# Patient Record
Sex: Female | Born: 1960 | Race: White | Hispanic: No | Marital: Married | Smoking: Never smoker
Health system: Southern US, Community
[De-identification: ages and names within clinical notes are randomized; demographics above are authoritative.]

## PROBLEM LIST (undated history)

## (undated) DIAGNOSIS — R3915 Urgency of urination: Secondary | ICD-10-CM

## (undated) DIAGNOSIS — F909 Attention-deficit hyperactivity disorder, unspecified type: Secondary | ICD-10-CM

## (undated) DIAGNOSIS — F329 Major depressive disorder, single episode, unspecified: Secondary | ICD-10-CM

## (undated) DIAGNOSIS — M1990 Unspecified osteoarthritis, unspecified site: Secondary | ICD-10-CM

## (undated) DIAGNOSIS — R310 Gross hematuria: Secondary | ICD-10-CM

## (undated) DIAGNOSIS — Z87448 Personal history of other diseases of urinary system: Secondary | ICD-10-CM

## (undated) DIAGNOSIS — F32A Depression, unspecified: Secondary | ICD-10-CM

## (undated) DIAGNOSIS — Z87440 Personal history of urinary (tract) infections: Secondary | ICD-10-CM

## (undated) DIAGNOSIS — M069 Rheumatoid arthritis, unspecified: Secondary | ICD-10-CM

## (undated) DIAGNOSIS — Q625 Duplication of ureter: Secondary | ICD-10-CM

## (undated) HISTORY — PX: URETERAL REIMPLANTION: SHX2611

## (undated) HISTORY — PX: CYSTOSCOPY WITH RETROGRADE PYELOGRAM, URETEROSCOPY AND STENT PLACEMENT: SHX5789

## (undated) HISTORY — PX: BLADDER SURGERY: SHX569

## (undated) HISTORY — PX: TONSILLECTOMY AND ADENOIDECTOMY: SUR1326

## (undated) HISTORY — PX: KIDNEY SURGERY: SHX687

## (undated) HISTORY — PX: CARPAL TUNNEL RELEASE: SHX101

---
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
## 2006-08-10 ENCOUNTER — Ambulatory Visit: Payer: Self-pay | Admitting: Family Medicine

## 2006-08-23 ENCOUNTER — Encounter: Payer: Self-pay | Admitting: Family Medicine

## 2006-09-03 ENCOUNTER — Ambulatory Visit: Payer: Self-pay | Admitting: Family Medicine

## 2006-09-08 ENCOUNTER — Ambulatory Visit: Payer: Self-pay | Admitting: Cardiology

## 2006-09-24 ENCOUNTER — Ambulatory Visit: Payer: Self-pay | Admitting: Cardiology

## 2006-09-24 ENCOUNTER — Ambulatory Visit: Payer: Self-pay

## 2006-09-24 ENCOUNTER — Encounter: Payer: Self-pay | Admitting: Cardiology

## 2006-09-28 DIAGNOSIS — F988 Other specified behavioral and emotional disorders with onset usually occurring in childhood and adolescence: Secondary | ICD-10-CM | POA: Insufficient documentation

## 2006-09-28 DIAGNOSIS — R002 Palpitations: Secondary | ICD-10-CM | POA: Insufficient documentation

## 2006-09-28 DIAGNOSIS — N393 Stress incontinence (female) (male): Secondary | ICD-10-CM | POA: Insufficient documentation

## 2006-09-28 DIAGNOSIS — J309 Allergic rhinitis, unspecified: Secondary | ICD-10-CM | POA: Insufficient documentation

## 2006-10-14 ENCOUNTER — Ambulatory Visit: Payer: Self-pay | Admitting: Family Medicine

## 2006-10-14 DIAGNOSIS — N808 Other endometriosis: Secondary | ICD-10-CM

## 2007-01-28 ENCOUNTER — Ambulatory Visit: Payer: Self-pay | Admitting: Family Medicine

## 2007-01-28 DIAGNOSIS — N644 Mastodynia: Secondary | ICD-10-CM | POA: Insufficient documentation

## 2007-02-02 ENCOUNTER — Other Ambulatory Visit: Admission: RE | Admit: 2007-02-02 | Discharge: 2007-02-02 | Payer: Self-pay | Admitting: Obstetrics and Gynecology

## 2007-04-26 ENCOUNTER — Ambulatory Visit: Payer: Self-pay | Admitting: Family Medicine

## 2007-04-26 DIAGNOSIS — F411 Generalized anxiety disorder: Secondary | ICD-10-CM | POA: Insufficient documentation

## 2007-04-26 DIAGNOSIS — N951 Menopausal and female climacteric states: Secondary | ICD-10-CM | POA: Insufficient documentation

## 2007-06-30 ENCOUNTER — Telehealth: Payer: Self-pay | Admitting: Family Medicine

## 2007-10-04 ENCOUNTER — Ambulatory Visit: Payer: Self-pay | Admitting: Family Medicine

## 2007-10-17 ENCOUNTER — Telehealth: Payer: Self-pay | Admitting: Family Medicine

## 2007-12-02 ENCOUNTER — Telehealth: Payer: Self-pay | Admitting: Family Medicine

## 2008-01-13 ENCOUNTER — Telehealth: Payer: Self-pay | Admitting: Family Medicine

## 2008-02-17 ENCOUNTER — Telehealth: Payer: Self-pay | Admitting: Family Medicine

## 2008-02-27 ENCOUNTER — Encounter: Payer: Self-pay | Admitting: Family Medicine

## 2008-04-23 ENCOUNTER — Telehealth: Payer: Self-pay | Admitting: Family Medicine

## 2008-04-24 ENCOUNTER — Other Ambulatory Visit: Admission: RE | Admit: 2008-04-24 | Discharge: 2008-04-24 | Payer: Self-pay | Admitting: Obstetrics and Gynecology

## 2008-04-24 ENCOUNTER — Encounter: Payer: Self-pay | Admitting: Family Medicine

## 2008-04-24 LAB — CONVERTED CEMR LAB
Albumin: 4.5 g/dL
Alkaline Phosphatase: 42 units/L
BUN: 19 mg/dL
CO2: 26 meq/L
Calcium: 10.6 mg/dL
Glucose, Bld: 92 mg/dL
Potassium: 5.1 meq/L
TSH: 1.58 microintl units/mL

## 2008-05-18 ENCOUNTER — Encounter: Payer: Self-pay | Admitting: Family Medicine

## 2008-05-18 ENCOUNTER — Telehealth: Payer: Self-pay | Admitting: Family Medicine

## 2008-05-18 DIAGNOSIS — R945 Abnormal results of liver function studies: Secondary | ICD-10-CM | POA: Insufficient documentation

## 2008-05-28 ENCOUNTER — Encounter: Payer: Self-pay | Admitting: Family Medicine

## 2008-05-29 ENCOUNTER — Encounter: Payer: Self-pay | Admitting: Family Medicine

## 2008-05-29 LAB — CONVERTED CEMR LAB
ALT: 55 units/L — ABNORMAL HIGH (ref 0–35)
AST: 35 units/L (ref 0–37)

## 2008-05-30 ENCOUNTER — Ambulatory Visit: Payer: Self-pay | Admitting: Family Medicine

## 2008-06-26 ENCOUNTER — Ambulatory Visit: Payer: Self-pay | Admitting: Family Medicine

## 2008-06-26 LAB — CONVERTED CEMR LAB
Glucose, Urine, Semiquant: NEGATIVE
Specific Gravity, Urine: <1.005
Urobilinogen, UA: 0.2
pH: 6

## 2008-07-27 ENCOUNTER — Ambulatory Visit: Payer: Self-pay | Admitting: Family Medicine

## 2008-08-20 ENCOUNTER — Telehealth: Payer: Self-pay | Admitting: Family Medicine

## 2008-10-09 ENCOUNTER — Ambulatory Visit: Payer: Self-pay | Admitting: Family Medicine

## 2008-10-10 ENCOUNTER — Ambulatory Visit: Payer: Self-pay | Admitting: Family Medicine

## 2008-10-10 DIAGNOSIS — R5383 Other fatigue: Secondary | ICD-10-CM

## 2008-10-10 DIAGNOSIS — R5381 Other malaise: Secondary | ICD-10-CM

## 2008-10-10 DIAGNOSIS — M2550 Pain in unspecified joint: Secondary | ICD-10-CM

## 2008-10-11 ENCOUNTER — Telehealth: Payer: Self-pay | Admitting: Family Medicine

## 2008-10-11 LAB — CONVERTED CEMR LAB
ALT: 43 units/L — ABNORMAL HIGH (ref 0–35)
AST: 26 units/L (ref 0–37)
Anti Nuclear Antibody(ANA): NEGATIVE
BUN: 11 mg/dL (ref 6–23)
Basophils Absolute: 0.1 10*3/uL (ref 0.0–0.1)
CO2: 22 meq/L (ref 19–32)
Calcium: 9.7 mg/dL (ref 8.4–10.5)
Chloride: 107 meq/L (ref 96–112)
Creatinine, Ser: 0.86 mg/dL (ref 0.40–1.20)
Eosinophils Absolute: 0.3 10*3/uL (ref 0.0–0.7)
Eosinophils Relative: 5 % (ref 0–5)
HCT: 45.8 % (ref 36.0–46.0)
Hemoglobin: 14.6 g/dL (ref 12.0–15.0)
MCV: 96 fL (ref 78.0–100.0)
Monocytes Absolute: 0.8 10*3/uL (ref 0.1–1.0)
Platelets: 347 10*3/uL (ref 150–400)
RDW: 13.2 % (ref 11.5–15.5)
Sed Rate: 14 mm/hr (ref 0–22)
TSH: 1.344 microintl units/mL (ref 0.350–4.50)
Total Bilirubin: 0.6 mg/dL (ref 0.3–1.2)

## 2008-10-15 ENCOUNTER — Telehealth: Payer: Self-pay | Admitting: Family Medicine

## 2008-10-30 ENCOUNTER — Encounter: Payer: Self-pay | Admitting: Family Medicine

## 2008-11-05 ENCOUNTER — Encounter: Payer: Self-pay | Admitting: Family Medicine

## 2008-11-09 ENCOUNTER — Ambulatory Visit: Payer: Self-pay | Admitting: Family Medicine

## 2008-11-09 DIAGNOSIS — R071 Chest pain on breathing: Secondary | ICD-10-CM

## 2008-12-03 ENCOUNTER — Telehealth: Payer: Self-pay | Admitting: Family Medicine

## 2009-01-08 ENCOUNTER — Telehealth: Payer: Self-pay | Admitting: Family Medicine

## 2009-01-28 ENCOUNTER — Telehealth: Payer: Self-pay | Admitting: Family Medicine

## 2009-02-14 ENCOUNTER — Telehealth: Payer: Self-pay | Admitting: Family Medicine

## 2009-03-25 ENCOUNTER — Telehealth: Payer: Self-pay | Admitting: Family Medicine

## 2009-03-25 ENCOUNTER — Encounter: Payer: Self-pay | Admitting: Family Medicine

## 2009-03-26 ENCOUNTER — Telehealth: Payer: Self-pay | Admitting: Family Medicine

## 2009-03-27 ENCOUNTER — Encounter: Payer: Self-pay | Admitting: Family Medicine

## 2009-04-17 ENCOUNTER — Telehealth: Payer: Self-pay | Admitting: Family Medicine

## 2009-04-17 ENCOUNTER — Ambulatory Visit: Payer: Self-pay | Admitting: Family Medicine

## 2009-04-17 DIAGNOSIS — N915 Oligomenorrhea, unspecified: Secondary | ICD-10-CM | POA: Insufficient documentation

## 2009-04-17 DIAGNOSIS — N390 Urinary tract infection, site not specified: Secondary | ICD-10-CM | POA: Insufficient documentation

## 2009-04-17 LAB — CONVERTED CEMR LAB
Bilirubin Urine: NEGATIVE
Glucose, Urine, Semiquant: 100
Specific Gravity, Urine: 1.015
Urobilinogen, UA: 1
WBC Urine, dipstick: NEGATIVE

## 2009-04-18 ENCOUNTER — Telehealth: Payer: Self-pay | Admitting: Family Medicine

## 2009-04-18 LAB — CONVERTED CEMR LAB
ALT: 188 units/L — ABNORMAL HIGH (ref 0–35)
AST: 98 units/L — ABNORMAL HIGH (ref 0–37)
Albumin: 4.8 g/dL (ref 3.5–5.2)
Alkaline Phosphatase: 41 units/L (ref 39–117)
BUN: 17 mg/dL (ref 6–23)
Basophils Absolute: 0.1 10*3/uL (ref 0.0–0.1)
Calcium: 9.8 mg/dL (ref 8.4–10.5)
Chloride: 104 meq/L (ref 96–112)
Eosinophils Relative: 4 % (ref 0–5)
HCT: 44.2 % (ref 36.0–46.0)
LH: 104.5 milliintl units/mL — ABNORMAL HIGH
Lymphocytes Relative: 30 % (ref 12–46)
Lymphs Abs: 1.3 10*3/uL (ref 0.7–4.0)
Neutro Abs: 2.3 10*3/uL (ref 1.7–7.7)
Neutrophils Relative %: 51 % (ref 43–77)
Platelets: 325 10*3/uL (ref 150–400)
Potassium: 5.5 meq/L — ABNORMAL HIGH (ref 3.5–5.3)
RDW: 14.7 % (ref 11.5–15.5)
Sodium: 137 meq/L (ref 135–145)
WBC: 4.4 10*3/uL (ref 4.0–10.5)

## 2009-04-22 ENCOUNTER — Telehealth (INDEPENDENT_AMBULATORY_CARE_PROVIDER_SITE_OTHER): Payer: Self-pay | Admitting: *Deleted

## 2009-05-15 ENCOUNTER — Telehealth: Payer: Self-pay | Admitting: Family Medicine

## 2009-05-16 ENCOUNTER — Telehealth: Payer: Self-pay | Admitting: Family Medicine

## 2009-05-17 ENCOUNTER — Encounter: Payer: Self-pay | Admitting: Family Medicine

## 2009-05-27 ENCOUNTER — Ambulatory Visit: Payer: Self-pay | Admitting: Family Medicine

## 2009-05-27 DIAGNOSIS — M069 Rheumatoid arthritis, unspecified: Secondary | ICD-10-CM | POA: Insufficient documentation

## 2009-05-28 ENCOUNTER — Encounter: Payer: Self-pay | Admitting: Family Medicine

## 2009-05-28 LAB — CONVERTED CEMR LAB: ALT: 29 units/L (ref 0–35)

## 2009-06-14 ENCOUNTER — Ambulatory Visit: Payer: Self-pay | Admitting: Family Medicine

## 2009-06-14 LAB — CONVERTED CEMR LAB
Bilirubin Urine: NEGATIVE
Glucose, Bld: 102 mg/dL
Nitrite: POSITIVE
WBC Urine, dipstick: NEGATIVE
pH: 5

## 2009-06-27 ENCOUNTER — Ambulatory Visit: Payer: Self-pay | Admitting: Family Medicine

## 2009-06-27 LAB — CONVERTED CEMR LAB
Glucose, Urine, Semiquant: 250
Nitrite: POSITIVE
Urobilinogen, UA: 1

## 2009-06-28 ENCOUNTER — Encounter: Payer: Self-pay | Admitting: Family Medicine

## 2009-07-04 ENCOUNTER — Telehealth: Payer: Self-pay | Admitting: Family Medicine

## 2009-07-05 ENCOUNTER — Encounter: Payer: Self-pay | Admitting: Family Medicine

## 2009-07-21 ENCOUNTER — Encounter: Payer: Self-pay | Admitting: Family Medicine

## 2009-07-30 ENCOUNTER — Encounter: Payer: Self-pay | Admitting: Family Medicine

## 2009-08-08 ENCOUNTER — Encounter: Payer: Self-pay | Admitting: Family Medicine

## 2009-08-19 ENCOUNTER — Telehealth: Payer: Self-pay | Admitting: Family Medicine

## 2009-08-22 ENCOUNTER — Telehealth: Payer: Self-pay | Admitting: Family Medicine

## 2009-09-09 ENCOUNTER — Encounter: Payer: Self-pay | Admitting: Family Medicine

## 2009-09-25 ENCOUNTER — Encounter: Payer: Self-pay | Admitting: Family Medicine

## 2009-10-02 ENCOUNTER — Telehealth: Payer: Self-pay | Admitting: Family Medicine

## 2009-10-28 ENCOUNTER — Ambulatory Visit: Payer: Self-pay | Admitting: Family Medicine

## 2009-11-19 ENCOUNTER — Telehealth: Payer: Self-pay | Admitting: Family Medicine

## 2009-11-27 ENCOUNTER — Encounter: Payer: Self-pay | Admitting: Family Medicine

## 2009-12-09 ENCOUNTER — Ambulatory Visit: Payer: Self-pay | Admitting: Family Medicine

## 2009-12-09 LAB — CONVERTED CEMR LAB
Glucose, Urine, Semiquant: NEGATIVE
Nitrite: POSITIVE
Specific Gravity, Urine: 1.02

## 2009-12-11 ENCOUNTER — Telehealth (INDEPENDENT_AMBULATORY_CARE_PROVIDER_SITE_OTHER): Payer: Self-pay | Admitting: *Deleted

## 2009-12-12 ENCOUNTER — Ambulatory Visit: Payer: Self-pay | Admitting: Family Medicine

## 2009-12-12 ENCOUNTER — Encounter: Payer: Self-pay | Admitting: Family Medicine

## 2009-12-12 LAB — CONVERTED CEMR LAB
Nitrite: POSITIVE
Urobilinogen, UA: 0.2

## 2009-12-13 ENCOUNTER — Encounter: Payer: Self-pay | Admitting: Family Medicine

## 2009-12-23 ENCOUNTER — Encounter: Payer: Self-pay | Admitting: Family Medicine

## 2009-12-25 ENCOUNTER — Telehealth: Payer: Self-pay | Admitting: Family Medicine

## 2009-12-26 ENCOUNTER — Telehealth: Payer: Self-pay | Admitting: Family Medicine

## 2009-12-27 ENCOUNTER — Ambulatory Visit: Payer: Self-pay | Admitting: Family Medicine

## 2009-12-27 DIAGNOSIS — R748 Abnormal levels of other serum enzymes: Secondary | ICD-10-CM | POA: Insufficient documentation

## 2010-01-24 ENCOUNTER — Ambulatory Visit: Payer: Self-pay | Admitting: Family Medicine

## 2010-01-24 LAB — CONVERTED CEMR LAB
Bilirubin Urine: NEGATIVE
Nitrite: NEGATIVE
Protein, U semiquant: NEGATIVE
Specific Gravity, Urine: 1.025

## 2010-01-27 ENCOUNTER — Encounter: Payer: Self-pay | Admitting: Family Medicine

## 2010-01-27 LAB — CONVERTED CEMR LAB
Alkaline Phosphatase: 60 units/L (ref 39–117)
Indirect Bilirubin: 0.3 mg/dL (ref 0.0–0.9)
Total Protein: 6.8 g/dL (ref 6.0–8.3)

## 2010-01-29 ENCOUNTER — Encounter: Payer: Self-pay | Admitting: Family Medicine

## 2010-01-30 ENCOUNTER — Ambulatory Visit: Payer: Self-pay | Admitting: Family Medicine

## 2010-01-30 ENCOUNTER — Telehealth (INDEPENDENT_AMBULATORY_CARE_PROVIDER_SITE_OTHER): Payer: Self-pay | Admitting: *Deleted

## 2010-01-30 LAB — CONVERTED CEMR LAB
Bilirubin Urine: NEGATIVE
Protein, U semiquant: NEGATIVE
Urobilinogen, UA: 0.2

## 2010-01-31 ENCOUNTER — Encounter: Payer: Self-pay | Admitting: Family Medicine

## 2010-02-13 ENCOUNTER — Encounter: Payer: Self-pay | Admitting: Family Medicine

## 2010-02-21 ENCOUNTER — Telehealth: Payer: Self-pay | Admitting: Family Medicine

## 2010-03-25 ENCOUNTER — Encounter: Payer: Self-pay | Admitting: Family Medicine

## 2010-03-28 ENCOUNTER — Telehealth: Payer: Self-pay | Admitting: Family Medicine

## 2010-04-03 ENCOUNTER — Telehealth: Payer: Self-pay | Admitting: Family Medicine

## 2010-04-07 ENCOUNTER — Ambulatory Visit: Payer: Self-pay | Admitting: Family Medicine

## 2010-04-07 LAB — CONVERTED CEMR LAB: Sed Rate: 20 mm/hr (ref 0–22)

## 2010-05-05 ENCOUNTER — Telehealth: Payer: Self-pay | Admitting: Family Medicine

## 2010-05-09 ENCOUNTER — Encounter: Payer: Self-pay | Admitting: Family Medicine

## 2010-06-25 ENCOUNTER — Encounter: Payer: Self-pay | Admitting: Family Medicine

## 2010-06-30 ENCOUNTER — Encounter: Payer: Self-pay | Admitting: Family Medicine

## 2010-07-30 ENCOUNTER — Telehealth: Payer: Self-pay | Admitting: Family Medicine

## 2010-09-26 ENCOUNTER — Telehealth: Payer: Self-pay | Admitting: Family Medicine

## 2010-10-10 ENCOUNTER — Encounter: Payer: Self-pay | Admitting: Family Medicine

## 2010-10-13 ENCOUNTER — Ambulatory Visit: Payer: Self-pay | Admitting: Family Medicine

## 2010-10-13 ENCOUNTER — Encounter: Admission: RE | Admit: 2010-10-13 | Discharge: 2010-10-13 | Payer: Self-pay | Admitting: Family Medicine

## 2010-10-13 ENCOUNTER — Telehealth (INDEPENDENT_AMBULATORY_CARE_PROVIDER_SITE_OTHER): Payer: Self-pay | Admitting: *Deleted

## 2010-11-11 ENCOUNTER — Ambulatory Visit: Payer: Self-pay | Admitting: Family Medicine

## 2010-11-11 DIAGNOSIS — J069 Acute upper respiratory infection, unspecified: Secondary | ICD-10-CM | POA: Insufficient documentation

## 2010-11-11 LAB — CONVERTED CEMR LAB: Rapid Strep: NEGATIVE

## 2010-12-01 ENCOUNTER — Encounter: Payer: Self-pay | Admitting: Family Medicine

## 2010-12-11 ENCOUNTER — Telehealth: Payer: Self-pay | Admitting: Family Medicine

## 2011-01-16 ENCOUNTER — Telehealth: Payer: Self-pay | Admitting: Family Medicine

## 2011-01-20 ENCOUNTER — Telehealth (INDEPENDENT_AMBULATORY_CARE_PROVIDER_SITE_OTHER): Payer: Self-pay | Admitting: *Deleted

## 2011-01-22 NOTE — Letter (Signed)
Summary: [facility]  [facility]   Imported By: Lanelle Bal 07/14/2010 08:28:50  _____________________________________________________________________  External Attachment:    Type:   Image     Comment:   External Document

## 2011-01-22 NOTE — Letter (Signed)
Summary: [facility]  [facility]   Imported By: Lanelle Bal 07/07/2010 14:05:14  _____________________________________________________________________  External Attachment:    Type:   Image     Comment:   External Document

## 2011-01-22 NOTE — Letter (Signed)
Summary: [facility]  [facility]   Imported By: Lanelle Bal 11/01/2010 11:47:30  _____________________________________________________________________  External Attachment:    Type:   Image     Comment:   External Document

## 2011-01-22 NOTE — Progress Notes (Signed)
Summary: Abnormal PPD reading     New Problems: NONSPEC REACT TUBERCULIN SKN TEST W/O ACTV TB (ICD-795.5)   New Problems: NONSPEC REACT TUBERCULIN SKN TEST W/O ACTV TB (ICD-795.5)   PPD Results    Date of reading: 10/13/2010    Results: > 15mm    Interpretation: positive   Appended Document: Abnormal PPD reading   Appended Document: Abnormal PPD reading   Appended Document: Abnormal PPD reading   Appended Document: Abnormal PPD reading

## 2011-01-22 NOTE — Assessment & Plan Note (Signed)
Summary: elevated liver enzymes   Vital Signs:  Patient profile:   50 year old female Height:      61.5 inches Weight:      140 pounds BMI:     26.12 O2 Sat:      97 % on Room air Pulse rate:   82 / minute BP sitting:   120 / 81  (left arm) Cuff size:   regular  Vitals Entered By: Payton Spark CMA (December 27, 2009 8:23 AM)  O2 Flow:  Room air CC: Discuss meds and labs from Dr. Carolan Clines. Was on methotrexate then put on leflunomide and now not taking either.    Primary Care Provider:  Seymour Bars D.O.  CC:  Discuss meds and labs from Dr. Carolan Clines. Was on methotrexate then put on leflunomide and now not taking either. .  History of Present Illness: 50 yo WF presents for f/u RA and elevated liver enzymes.  She was seeing Dr Carolan Clines at [facility] for RA, on MTX for 3 months from 2009-2010 then changed to Luflinomide from 02-2009 to 11-28-2009.  her LFTs went up and she was taken off. Her f/u labs from 12-23-2009 show that her AST dropped from 70--> 58 and her ALT went from 122--> 121.  She is not having any easy bruising or bleeding but she is having itchy skin.  She does not want to go back to Dr Carolan Clines.  She is seeing a homeopathic practioner now and doing accupuncture.  She is not having any acute joint pain.    She drinks no more than 2 ETOH / wk and she does not taking Tylenolol. She had a sensation of food getting stuck while eating pizza during a party 2 wks ago.  She did not vomit but has felt like her food/ drink causes pressure in the upper esophagus.    Current Medications (verified): 1)  Urised  Tabs (Methen-Bella-Meth Bl-Phen Sal) .Marland Kitchen.. 1 Tab By Mouth Q6h As Needed Bladder Pain 2)  Cyanocobalamin 1000 Mcg Tabs (Cyanocobalamin) .... 2 Tabs By Mouth Qd 3)  Dextrostat 10 Mg  Tabs (Dextroamphetamine Sulfate) .Marland Kitchen.. 1 Tab By Mouth Bid 4)  Fluoxetine Hcl 10 Mg Caps (Fluoxetine Hcl) .Marland Kitchen.. 1 Tab By Mouth Daily 5)  Vit E .... 1600 International Units Daily 6)  Borage Oil 1000  Mg Caps (Borage (Borago Officinalis)) .Marland Kitchen.. 1000 Mg Daily 7)  Voltaren 1 % Gel (Diclofenac Sodium) .... Apply As Directed Up To 4 X A Day 8)  Nitrofurantoin Macrocrystal 50 Mg Caps (Nitrofurantoin Macrocrystal) .... Take 1 Tablet By Mouth Once A Day X 1 Post Coital As Needed 9)  Alprazolam 0.5 Mg Tabs (Alprazolam) .... 1/2- 1 Tab By Mouth Up To Bid As Needed For Anxiety. 10)  Keflex 500 Mg Caps (Cephalexin) .Marland Kitchen.. 1 Capsule By Mouth Two Times A Day X 7 Days 11)  Pyridium 200 Mg Tabs (Phenazopyridine Hcl) .Marland Kitchen.. 1 Tab By Mouth Three Times A Day X 2 Days  Allergies (verified): 1)  ! Levaquin  Past History:  Past Medical History: Reviewed history from 06/27/2009 and no changes required. 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  bunionectom  laparoscopy ureter surgery 2D echo and nuclear ETT per Dr Jens Som normal LEEP 1995  Social History: Reviewed history from 05/27/2009 and no changes required. 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  Has 50 yo son and 9 yo daughter.  Nonsmoker.  2 ETOH drinks per wk.  Works out 3-4 days/wk.  Sexually active.  Review of Systems      See HPI  Physical Exam  General:  alert, well-developed, well-nourished, well-hydrated, and overweight-appearing.   Head:  normocephalic and atraumatic.   Eyes:  sclera non icteric Mouth:  pharynx pink and moist.   Neck:  no masses.   Lungs:  Normal respiratory effort, chest expands symmetrically. Lungs are clear to auscultation, no crackles or wheezes. Heart:  Normal rate and regular rhythm. S1 and S2 normal without gallop, murmur, click, rub or other extra sounds. Abdomen:  Bowel sounds positive,abdomen soft and non-tender without masses, organomegaly Extremities:  no LE edema Skin:  excoriations with spider angioma over  upper chest no jaundice no bruising no palmar erythema Cervical Nodes:  No lymphadenopathy noted Psych:  good eye contact, not anxious appearing, and not depressed appearing.     Impression & Recommendations:  Problem # 1:  OTHER NONSPECIFIC ABNORMAL SERUM ENZYME LEVELS (ICD-790.5) We reviewed her Jan 3rd labs from Dr Carolan Clines and she continues to have high AST/ALT levels despite 4 wks of RA meds.  She has some itchy skin both otherwise has no sequelae.    Will recheck her in 1 month, adding a RUQ u/s if still elevated.  Problem # 2:  RHEUMATOID ARTHRITIS (ICD-714.0) RA, no acute flare ups and off all RX meds at this time. She does not want to see Dr Carolan Clines back for f/u.  Complete Medication List: 1)  Urised Tabs (Methen-bella-meth bl-phen sal) .Marland Kitchen.. 1 tab by mouth q6h as needed bladder pain 2)  Cyanocobalamin 1000 Mcg Tabs (Cyanocobalamin) .... 2 tabs by mouth qd 3)  Dextrostat 10 Mg Tabs (Dextroamphetamine sulfate) .Marland Kitchen.. 1 tab by mouth bid 4)  Fluoxetine Hcl 10 Mg Caps (Fluoxetine hcl) .Marland Kitchen.. 1 tab by mouth daily 5)  Vit E  .... 1600 international units daily 6)  Borage Oil 1000 Mg Caps (Borage (borago officinalis)) .Marland Kitchen.. 1000 mg daily 7)  Voltaren 1 % Gel (Diclofenac sodium) .... Apply as directed up to 4 x a day 8)  Alprazolam 0.5 Mg Tabs (Alprazolam) .... 1/2- 1 tab by mouth up to bid as needed for anxiety.  Patient Instructions: 1)  Continue current treatment plan. 2)  Recheck hepatic function panel in 4 wks. 3)  If still elevated, will get an u/s of your liver. 4)  Try Dexilant 1 capsule by mouth daily for acid reflux. 5)  If you continue to feel food getting 'stuck' will need to see GI for endoscopy. 6)  Return OV in 4 wks with labs. Prescriptions: FLUOXETINE HCL 10 MG CAPS (FLUOXETINE HCL) 1 tab by mouth daily  #30 x 3   Entered and Authorized by:   Seymour Bars DO   Signed by:   Seymour Bars DO on 12/27/2009   Method used:   Electronically to        [facility]  [phone]* (retail)       [address]       [place], [place]  [address]       Ph: [phone] or [phone]       Fax: [phone]   RxID:   [id number] FLUOXETINE HCL 10 MG CAPS (FLUOXETINE HCL) 1 tab by mouth daily  #30 x 3   Entered and Authorized by:   Seymour Bars DO   Signed by:   Seymour Bars DO on 12/27/2009  Method used:   Electronically to        [facility] [phone]* (retail)       [address] [place], [place]  [address]       Ph: [phone] or [phone]       Fax: [phone]   RxID:   [id number] ALPRAZOLAM 0.5 MG TABS (ALPRAZOLAM) 1/2- 1 tab by mouth up to bid as needed for anxiety.  #30 x 0   Entered and Authorized by:   Seymour Bars DO   Signed by:   Seymour Bars DO on 12/27/2009   Method used:   Printed then faxed to ...       [facility] [phone]* (retail)       [address] [place], [place]  [address]       Ph: [phone] or [phone]       Fax: [phone]   RxID:   [id number]

## 2011-01-22 NOTE — Assessment & Plan Note (Signed)
Summary: nurse visit, repeat UA  Nurse Visit   Pt also requests refill on prednisone for her RA. Please advise.    Impression & Recommendations:  Problem # 1:  UTI (ICD-599.0) UA + again for infection. Pt's visit changed to nurse visit, showed up 20 min late. Treat with Bactrim DS, send for cx. Get back in with Dr Retta Diones. Her updated medication list for this problem includes:    Urised Tabs (Methen-bella-meth bl-phen sal) .Marland Kitchen... 1 tab by mouth q6h as needed bladder pain    Bactrim Ds 800-160 Mg Tabs (Sulfamethoxazole-trimethoprim) .Marland Kitchen... 1 tab by mouth two times a day x 7 days  Orders: UA Dipstick w/o Micro (automated)  (81003) T-Culture, Urine ([id number])  Complete Medication List: 1)  Urised Tabs (Methen-bella-meth bl-phen sal) .Marland Kitchen.. 1 tab by mouth q6h as needed bladder pain 2)  Cyanocobalamin 1000 Mcg Tabs (Cyanocobalamin) .... 2 tabs by mouth qd 3)  Dextrostat 10 Mg Tabs (Dextroamphetamine sulfate) .Marland Kitchen.. 1 tab by mouth bid 4)  Fluoxetine Hcl 10 Mg Caps (Fluoxetine hcl) .Marland Kitchen.. 1 tab by mouth daily 5)  Vit E  .... 1600 international units daily 6)  Borage Oil 1000 Mg Caps (Borage (borago officinalis)) .Marland Kitchen.. 1000 mg daily 7)  Voltaren 1 % Gel (Diclofenac sodium) .... Apply as directed up to 4 x a day 8)  Alprazolam 0.5 Mg Tabs (Alprazolam) .... 1/2- 1 tab by mouth up to bid as needed for anxiety. 9)  Prednisone 10 Mg Tabs (Prednisone) .... Take as directed 10)  Bactrim Ds 800-160 Mg Tabs (Sulfamethoxazole-trimethoprim) .Marland Kitchen.. 1 tab by mouth two times a day x 7 days   Allergies: 1)  ! Levaquin Laboratory Results   Urine Tests    Routine Urinalysis   Color: orange Appearance: Clear Glucose: negative   (Normal Range: Negative) Bilirubin: negative   (Normal Range: Negative) Ketone: negative   (Normal Range: Negative) Spec. Gravity: <1.005   (Normal Range: 1.003-1.035) Blood: trace-intact   (Normal Range: Negative) pH: 5.0   (Normal Range: 5.0-8.0) Protein: negative    (Normal Range: Negative) Urobilinogen: 0.2   (Normal Range: 0-1) Nitrite: positive   (Normal Range: Negative) Leukocyte Esterace: small   (Normal Range: Negative)       Orders Added: 1)  UA Dipstick w/o Micro (automated)  [81003] 2)  T-Culture, Urine [[id number]] Prescriptions: BACTRIM DS 800-160 MG TABS (SULFAMETHOXAZOLE-TRIMETHOPRIM) 1 tab by mouth two times a day x 7 days  #14 x 0   Entered and Authorized by:   Seymour Bars DO   Signed by:   Seymour Bars DO on 01/30/2010   Method used:   Electronically to        [facility] [phone]* (retail)       [address]       [place], [place]  [address]       Ph: [phone] or [phone]       Fax: [phone]   RxID:   [id number] PREDNISONE 10 MG TABS (PREDNISONE) take as directed  #30 x 0   Entered and Authorized by:   Seymour Bars DO   Signed by:   Seymour Bars DO on 01/30/2010   Method used:   Electronically to        [facility] [phone]* (retail)       [address]       [place], [place]  [address]       Ph: [phone] or [phone]       Fax: [phone]   RxID:  [id number]     Patient Instructions: 1)  UA is + for infection. 2)  Treat with 7 days of Bactrim DS 3)  Sent for culture which should be back on Monday. 4)  We will get you back in with Dr Retta Diones for recurring UTI. 5)  Prednisone RFd.  Appended Document: nurse visit, repeat UA Pt has ?'s about fatty liver dz and the meds she was taking for RA. Pt requested a call from to discuss.   Appended Document: nurse visit, repeat UA I called her back in regards to NAFLD on u/s with diet/ exercise recommendations. She will schedule her own f/u with Dr Retta Diones.  Seymour Bars, D.O.

## 2011-01-22 NOTE — Progress Notes (Signed)
Summary: Dextrostat Rx  Phone Note Refill Request Message from:  Patient on December 25, 2009 10:40 AM  Refills Requested: Medication #1:  DEXTROSTAT 10 MG  TABS 1 tab by mouth bid Initial call taken by: Payton Spark CMA,  December 25, 2009 10:40 AM    Prescriptions: DEXTROSTAT 10 MG  TABS (DEXTROAMPHETAMINE SULFATE) 1 tab by mouth bid  #60 x 0   Entered and Authorized by:   Seymour Bars DO   Signed by:   Seymour Bars DO on 12/25/2009   Method used:   Printed then faxed to ...       [facility] [phone]* (retail)       [address] [place], [place]  [address]       Ph: [phone] or [phone]       Fax: [phone]   RxID:   [id number]   Appended Document: Dextrostat Rx Rx left at desk. Pt will pick up this afternoon

## 2011-01-22 NOTE — Letter (Signed)
Summary: [facility]   Imported By: Lanelle Bal 02/20/2010 10:22:23  _____________________________________________________________________  External Attachment:    Type:   Image     Comment:   External Document

## 2011-01-22 NOTE — Progress Notes (Signed)
Summary: Refills  Phone Note Refill Request Message from:  Patient on February 21, 2010 8:38 AM  Refills Requested: Medication #1:  DEXTROSTAT 10 MG  TABS 1 tab by mouth bid  Medication #2:  ALPRAZOLAM 0.5 MG TABS 1/2- 1 tab by mouth up to bid as needed for anxiety. Initial call taken by: Payton Spark CMA,  February 21, 2010 8:38 AM    Prescriptions: ALPRAZOLAM 0.5 MG TABS (ALPRAZOLAM) 1/2- 1 tab by mouth up to bid as needed for anxiety.  #30 x 0   Entered and Authorized by:   Seymour Bars DO   Signed by:   Seymour Bars DO on 02/21/2010   Method used:   Printed then faxed to ...       [facility] [phone]* (retail)       [address]       [place], [place]  [address]       Ph: [phone] or [phone]       Fax: [phone]   RxID:   [id number] DEXTROSTAT 10 MG  TABS (DEXTROAMPHETAMINE SULFATE) 1 tab by mouth bid  #60 x 0   Entered and Authorized by:   Seymour Bars DO   Signed by:   Seymour Bars DO on 02/21/2010   Method used:   Printed then faxed to ...       [facility] [phone]* (retail)       [address]       [place], [place]  [address]       Ph: [phone] or [phone]       Fax: [phone]   RxID:   [id number]   Appended Document: Refills [name] informing Pt

## 2011-01-22 NOTE — Assessment & Plan Note (Signed)
Summary: RA flare   Vital Signs:  Patient profile:   50 year old female Height:      61.5 inches Weight:      143 pounds BMI:     26.68 O2 Sat:      97 % on Room air Pulse rate:   83 / minute BP sitting:   128 / 84  (left arm) Cuff size:   regular  Vitals Entered By: Payton Spark CMA (April 07, 2010 10:40 AM)  O2 Flow:  Room air CC: F/U. RA flare. Requests prednisone.    Primary Care Provider:  Seymour Bars D.O.  CC:  F/U. RA flare. Requests prednisone. Marland Kitchen  History of Present Illness: 70 yr WF presents for RA flare up.  She is having pain in both hands and her neck with redness, warmth and swelling x 2 wks.  She is running out of RX Prednisone.  She has appt with Dr Corliss Skains to est care May 20th in [place].  This will be her 3rd rheumatologist.  For the past year and half, she has been trying 'natural' remidies but understands that that is not enough now.  She has had problems with elevated LFTS from previous RA meds that were used.  She is using Voltaren gel and a R wrist splint but she is having more pain and trouble sleeping now.    She was recently admitted to the hospital for UTI (03/23/2010). She was discharged with Cipro. She denies any continuning symptoms of the UTI. She sees Dr. Beverely Pace.   Current Medications (verified): 1)  Cyanocobalamin 1000 Mcg Tabs (Cyanocobalamin) .... 2 Tabs By Mouth Qd 2)  Dextrostat 10 Mg  Tabs (Dextroamphetamine Sulfate) .Marland Kitchen.. 1 Tab By Mouth Bid 3)  Fluoxetine Hcl 10 Mg Caps (Fluoxetine Hcl) .Marland Kitchen.. 1 Tab By Mouth Daily 4)  Vit E .... 1600 International Units Daily 5)  Borage Oil 1000 Mg Caps (Borage (Borago Officinalis)) .Marland Kitchen.. 1000 Mg Daily 6)  Voltaren 1 % Gel (Diclofenac Sodium) .... Apply As Directed Up To 4 X A Day 7)  Alprazolam 0.5 Mg Tabs (Alprazolam) .... 1/2- 1 Tab By Mouth Up To Bid As Needed For Anxiety. 8)  Prednisone 10 Mg Tabs (Prednisone) .... Take As Directed  Allergies (verified): 1)  ! Levaquin  Past History:  Past Medical  History: congenital extra ureter  endometriosis G2P1102 NSVDs pap smears Dr Thomasena Edis 2-09 perimenopausal RA -- Dr Dareen Piano --> Dr Carolan Clines Dr Corliss Skains uro: Dr Dalstedt--> Dr Beverely Pace  Past Surgical History: Reviewed history from 04/26/2007 and no changes required.  bunionectom  laparoscopy ureter surgery 2D echo and nuclear ETT per Dr Jens Som normal LEEP 1995  Family History: Reviewed history from 10/10/2008 and no changes required. 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  Nonsmoker.  2 ETOH drinks per wk.  Works out 3-4 days/wk.  Sexually active.  Review of Systems General:  Denies fatigue, fever, sleep disorder, sweats, and weakness.  Physical Exam  General:  alert, well-developed, well-nourished, and well-hydrated.   Head:  normocephalic, atraumatic, and no abnormalities observed.   Mouth:  good dentition and pharynx pink and moist.   Neck:  globally limited active C spine ROM Lungs:  normal respiratory effort and normal breath sounds.   Heart:  normal rate, regular rhythm,  and no murmur.   Msk:  Synovial nodule on the proximal scaphoid and radius of the left wrist. active synovitis both R and L hand / finger joints with redness, edema and tenderness  Pulses:  R radial normal and L radial normal.   Skin:  turgor normal, color normal, and no rashes.   Psych:  normally interactive, good eye contact, not anxious appearing, and not depressed appearing.     Impression & Recommendations:  Problem # 1:  RHEUMATOID ARTHRITIS (ICD-714.0) RA flare up.  Treat with Prednisone taper.  She has appt with Dr Corliss Skains in 1 month for f/u. ESR today.  Voltaren gel as needed.  May need Tramadol at night. Her updated medication list for this problem  includes:    Prednisone 10 Mg Tabs (Prednisone) .Marland Kitchen... Take as directed  Orders: T-Sed Rate (Automated) [phone])  Complete Medication List: 1)  Cyanocobalamin 1000 Mcg Tabs (Cyanocobalamin) .... 2 tabs by mouth qd 2)  Dextrostat 10 Mg Tabs (Dextroamphetamine sulfate) .Marland Kitchen.. 1 tab by mouth bid 3)  Fluoxetine Hcl 10 Mg Caps (Fluoxetine hcl) .Marland Kitchen.. 1 tab by mouth daily 4)  Vit E  .... 1600 international units daily 5)  Borage Oil 1000 Mg Caps (Borage (borago officinalis)) .Marland Kitchen.. 1000 mg daily 6)  Voltaren 1 % Gel (Diclofenac sodium) .... Apply as directed up to 4 x a day 7)  Alprazolam 0.5 Mg Tabs (Alprazolam) .... 1/2- 1 tab by mouth up to bid as needed for anxiety. 8)  Prednisone 10 Mg Tabs (Prednisone) .... Take as directed  Patient Instructions: 1)  Take Prednisone 4 tabs (40 mg) once a day for 3 days then  2)  3 tabs once a day (30 mg) for 3 days then  3)  2 tabs once a day (20 mg) for 3 days then 4)  1 tab by mouth once a day (10 mg) for 3 days then 5)  1/2 tab by mouth once a day for 3 days then STOP. 6)  F/U with Dr Corliss Skains in May. 7)  ESR today. 8)  Will call you w/ results tomorrow. 9)  Call if you need to add any meds for pain.  10)    Prescriptions: PREDNISONE 10 MG TABS (PREDNISONE) take as directed  #60 x 0   Entered and Authorized by:   Seymour Bars DO   Signed by:   Seymour Bars DO on 04/07/2010   Method used:   Electronically to        [facility] [phone]* (retail)       [address]       [place], [place]  [address]       Ph: [phone] or [phone]       Fax: [phone]   RxID:   [id number]

## 2011-01-22 NOTE — Assessment & Plan Note (Signed)
Summary: f/u liver enzymes   Vital Signs:  Patient profile:   50 year old female Height:      61.5 inches Weight:      142 pounds BMI:     26.49 Pulse rate:   100 / minute BP sitting:   114 / 84  Vitals Entered By: Kandice Hams (January 24, 2010 9:02 AM) CC: 4 WK FOLLOWUP CK LIVER ENZYMES AND URINE RECHECK   Primary Care Provider:  Seymour Bars D.O.  CC:  4 WK FOLLOWUP CK LIVER ENZYMES AND URINE RECHECK.  History of Present Illness: 50 yo WF presents for f/u UTI and elevated liver enzymes secondary to meds used by Dr Carolan Clines for RA.  she has been off her RA meds for 8 wks now.  Her LFTs 1 month ago remained high.  She is drinking limited ETOH and avoids tylenol.  Due to recheck today.  Pruitis has improved.  She o/w feels great.  UTI symptoms have completeley cleared up.  Allergies: 1)  ! Levaquin  Past History:  Past Medical History: Reviewed history from 06/27/2009 and no changes required. 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  bunionectom  laparoscopy ureter surgery 2D echo and nuclear ETT per Dr Jens Som normal LEEP 1995  Social History: Reviewed history from 05/27/2009 and no changes required. 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  Nonsmoker.  2 ETOH drinks per wk.  Works out 3-4 days/wk.  Sexually active.  Review of Systems      See HPI  Physical Exam  General:  alert, well-developed, well-nourished, and well-hydrated.   Head:  normocephalic and atraumatic.   Eyes:  sclera non icteric Mouth:  good dentition and pharynx pink and moist.   Neck:  no masses.   Lungs:  Normal respiratory effort, chest expands symmetrically. Lungs are clear to auscultation, no crackles or wheezes. Heart:  Normal rate and regular rhythm. S1 and S2  normal without gallop, murmur, click, rub or other extra sounds. Skin:  color normal.  no jaundice. no palmar erythema.  Mild upper chest spider angiomas Cervical Nodes:  No lymphadenopathy noted Psych:  good eye contact, not anxious appearing, and not depressed appearing.     Impression & Recommendations:  Problem # 1:  OTHER NONSPECIFIC ABNORMAL SERUM ENZYME LEVELS (ICD-790.5) Recheck LFTs today.  Off liver toxic RA meds x 8 wks now. If still high, will get a RUQ u/s.  Problem # 2:  UTI (ICD-599.0)  UA came back to normal.  Will need f/u with Dr Normajean Baxter if continuing to have problems. Her updated medication list for this problem includes:    Urised Tabs (Methen-bella-meth bl-phen sal) .Marland Kitchen... 1 tab by mouth q6h as needed bladder pain  Orders: UA Dipstick w/o Micro (automated)  (81003)  Problem # 3:  RHEUMATOID ARTHRITIS (ICD-714.0) Check ESR today.  she has an active L wrist synovitis today. Orders: T-Sed Rate (Automated) [phone])  Complete Medication List: 1)  Urised Tabs (Methen-bella-meth bl-phen sal) .Marland Kitchen.. 1 tab by mouth q6h as needed bladder pain 2)  Cyanocobalamin 1000 Mcg Tabs (Cyanocobalamin) .... 2 tabs by mouth qd 3)  Dextrostat 10 Mg Tabs (Dextroamphetamine sulfate) .Marland Kitchen.. 1 tab by mouth bid 4)  Fluoxetine Hcl 10 Mg Caps (Fluoxetine hcl) .Marland Kitchen.. 1 tab by mouth daily 5)  Vit E  .... 1600 international units daily 6)  Borage Oil 1000 Mg Caps (Borage (borago officinalis)) .Marland Kitchen.. 1000 mg daily 7)  Voltaren 1 % Gel (Diclofenac sodium) .... Apply as directed up to 4 x a day 8)  Alprazolam 0.5 Mg Tabs (Alprazolam) .... 1/2- 1 tab by mouth up to bid as needed for anxiety.  Other Orders: T-Liver Profile [phone])  Patient Instructions: 1)  Labs today. 2)  Will call you w/ results on Monday. 3)  If LFTs are elevated, will get a RUQ u/s. 4)  Urine looks perfect.   5)  Use Prilosec OTC as needed.  Laboratory Results   Urine Tests    Routine Urinalysis   Color:  yellow Appearance: Clear Glucose: negative   (Normal Range: Negative) Bilirubin: negative   (Normal Range: Negative) Ketone: negative   (Normal Range: Negative) Spec. Gravity: 1.025   (Normal Range: 1.003-1.035) Blood: trace-intact   (Normal Range: Negative) pH: 5.5   (Normal Range: 5.0-8.0) Protein: negative   (Normal Range: Negative) Urobilinogen: 0.2   (Normal Range: 0-1) Nitrite: negative   (Normal Range: Negative) Leukocyte Esterace: negative   (Normal Range: Negative)

## 2011-01-22 NOTE — Progress Notes (Signed)
Summary: US shows fatty liver  Phone Note Outgoing Call   Summary of Call: Marcelino Duster, Pls call pt and let her know that her RUQ u/s shows fatty liver dz.  Treatment for this includes low fat diet, exercise and wt loss.  Have her f/u with me for f/u of fatty liver dz in 3 mos.   Initial call taken by: Seymour Bars DO,  January 30, 2010 10:11 AM  Follow-up for Phone Call        Pt has apt this afternoon. Will discuss then Follow-up by: Payton Spark CMA,  January 30, 2010 12:16 PM

## 2011-01-22 NOTE — Progress Notes (Signed)
Summary: Referral  Phone Note Call from Patient   Caller: Patient Summary of Call: Dr.Demarcus Thielke       Patient Call Back  [phone]  Patient wants to talk to the nurse or Dr. Dayton Scrape getting a referral. Initial call taken by: Vanessa Swaziland,  March 28, 2010 8:28 AM  Follow-up for Phone Call        Pt would like referral to see another rheum. She does not want to go back to Dr. Vanessa Kick but does prefer to stay in the [place] area.  Follow-up by: Payton Spark CMA,  March 28, 2010 8:39 AM  Additional Follow-up for Phone Call Additional follow up Details #1::        I reviewed her chart and this was the 2nd rheumatology referral that we made for her. It is up to her to call around to make an appt and let us know if our office needs to send anything. Additional Follow-up by: Seymour Bars DO,  March 30, 2010 8:05 PM     Appended Document: Referral Pt aware  Appended Document: Referral Pt states she would like to see  Hedwig Morton in [facility] office # [phone]. Pt tried to call but unable to make apt herself.   Appended Document: Referral

## 2011-01-22 NOTE — Progress Notes (Signed)
Summary: lab f/u  Phone Note Outgoing Call   Summary of Call: Pls let pt know that I left a message for Dr Carolan Clines to discuss her case.  Can you pls review w/ her what meds she is taking since her Liver enzymes remain high?  Thanks.   Initial call taken by: Seymour Bars DO,  December 26, 2009 1:32 PM  Follow-up for Phone Call        Per Dr. Cephus Richer secretary-Pt is not currently on any meds from Dr. Carolan Clines. He removed her from meds once her liver enzymes became elevated. I requested all of her records from their office and secretary agreed to have them faxed.  Follow-up by: Payton Spark CMA,  December 26, 2009 1:35 PM  Additional Follow-up for Phone Call Additional follow up Details #1::        I will talk to her in person at tomorrow's OV. Additional Follow-up by: Seymour Bars DO,  December 26, 2009 1:48 PM

## 2011-01-22 NOTE — Consult Note (Signed)
Summary: [facility]  [facility]   Imported By: Lanelle Bal 08/20/2010 14:08:56  _____________________________________________________________________  External Attachment:    Type:   Image     Comment:   External Document

## 2011-01-22 NOTE — Letter (Signed)
Summary: [facility]   Imported By: Lanelle Bal 12/12/2010 08:47:26  _____________________________________________________________________  External Attachment:    Type:   Image     Comment:   External Document

## 2011-01-22 NOTE — Progress Notes (Signed)
Summary: refill  Phone Note Refill Request Message from:  Patient on December 11, 2010 8:21 AM  Refills Requested: Medication #1:  DEXTROSTAT 10 MG  TABS 1 tab by mouth bid   Supply Requested: 1 month Call when ready [phone]   Method Requested: Pick up at Office Initial call taken by: Kathlene November LPN,  December 11, 2010 8:21 AM    Prescriptions: DEXTROSTAT 10 MG  TABS (DEXTROAMPHETAMINE SULFATE) 1 tab by mouth bid  #60 x 0   Entered and Authorized by:   Seymour Bars DO   Signed by:   Seymour Bars DO on 12/11/2010   Method used:   Print then Give to Patient   RxID:   [id number]

## 2011-01-22 NOTE — Progress Notes (Signed)
Summary: Prednisone refill  Phone Note Refill Request   Refills Requested: Medication #1:  PREDNISONE 10 MG TABS take as directed Initial call taken by: Payton Spark CMA,  April 03, 2010 3:05 PM  Follow-up for Phone Call        Denied.  I referred her to another rheumatologist which she refused.    She needs to see rheumatology not stay on Prednisone long term. Follow-up by: Seymour Bars DO,  April 03, 2010 3:24 PM     Appended Document: Prednisone refill [facility] informing Pt

## 2011-01-22 NOTE — Assessment & Plan Note (Signed)
Summary: TB +   Vital Signs:  Patient profile:   50 year old female Height:      61.5 inches Weight:      146 pounds BMI:     27.24 O2 Sat:      96 % on Room air Temp:     98.3 degrees F oral Pulse rate:   87 / minute BP sitting:   137 / 91  (left arm) Cuff size:   regular  Vitals Entered By: Payton Spark CMA (October 13, 2010 11:07 AM)  O2 Flow:  Room air CC: Abnormal PPD reading   Primary Care Provider:  Seymour Bars D.O.  CC:  Abnormal PPD reading.  History of Present Illness: 50 yo WF presents for a TB read today that was administered on Friday at Dr Reginia Forts office.  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  She has never felt symptomatic from TB.  She was hoping to start on Embrel with Dr Corliss Skains for RA.  She tested + today with 15 mm of induration that is hard and red.   She has a hx of elevated LFTs and really does not want to go on treatment for TB.  She denies wt loss, fevers, chills or cough.  Current Medications (verified): 1)  Cyanocobalamin 1000 Mcg Tabs (Cyanocobalamin) .... 2 Tabs By Mouth Qd 2)  Dextrostat 10 Mg  Tabs (Dextroamphetamine Sulfate) .Marland Kitchen.. 1 Tab By Mouth Bid 3)  Fluoxetine Hcl 10 Mg Caps (Fluoxetine Hcl) .Marland Kitchen.. 1 Tab By Mouth Daily 4)  Vit E .... 1600 International Units Daily 5)  Borage Oil 1000 Mg Caps (Borage (Borago Officinalis)) .Marland Kitchen.. 1000 Mg Daily 6)  Plaquenil 200 Mg Tabs (Hydroxychloroquine Sulfate) .... Take 1 Tab By Mouth Two Times A Day 7)  Sulfazine 500 Mg Tabs (Sulfasalazine) .... Take 1 Tab By Mouth Two Times A Day  Allergies (verified): 1)  ! Levaquin  Past History:  Past Medical History: congenital extra ureter  endometriosis [id number] NSVDs pap smears Dr Thomasena Edis 2-09 perimenopausal RA -- Dr Dareen Piano --> Dr Carolan Clines Dr Corliss Skains uro: Dr Dalstedt--> Dr Beverely Pace + PPD dating back to 1988, no treatment  Past Surgical History: Reviewed history from 04/26/2007 and no changes required.  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  Nonsmoker.  2 ETOH drinks per wk.  Works out 3-4 days/wk.  Sexually active.  Review of Systems      See HPI  Physical Exam  General:  alert, well-developed, well-nourished, and well-hydrated.   Lungs:  Normal respiratory effort, chest expands symmetrically. Lungs are clear to auscultation, no crackles or wheezes. Heart:  Normal rate and regular rhythm. S1 and S2 normal without gallop, murmur, click, rub or other extra sounds. Msk:  mild PIP synovitis Skin:  color normal.   Psych:  good eye contact, not anxious appearing, and not depressed appearing.     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 Will send OV note and CXR to Dr Corliss Skains to f/u her next steps for RA managment.  Cannot start Embrel unless TB is treated. Will send report to [facility].   Orders: T-DG Chest 2 View [phone])  Complete Medication List: 1)  Cyanocobalamin 1000 Mcg Tabs (Cyanocobalamin) .... 2 tabs by mouth qd 2)  Dextrostat 10 Mg Tabs (Dextroamphetamine sulfate) .Marland Kitchen.. 1 tab by mouth bid 3)  Fluoxetine Hcl 10 Mg Caps (Fluoxetine hcl) .Marland Kitchen.. 1 tab by mouth daily 4)  Vit E  .... 1600 international units daily 5)  Borage Oil 1000 Mg Caps (Borage (borago officinalis)) .Marland Kitchen.. 1000 mg daily 6)  Plaquenil 200 Mg Tabs (Hydroxychloroquine sulfate) .... Take 1 tab by mouth two times a day 7)  Sulfazine 500 Mg Tabs (Sulfasalazine) .... Take 1 tab by mouth two times a day   Orders Added: 1)  T-DG Chest 2 View [71020] 2)  Est. Patient Level III [78295]

## 2011-01-22 NOTE — Progress Notes (Addendum)
Summary: Refill med  Phone Note Refill Request Call back at Home Phone [phone] Message from:  Patient  Refills Requested: Medication #1:  ALPRAZOLAM 0.5 MG TABS 1 tab by mouth once daily as needed for anxiety.   Dosage confirmed as above?Dosage Confirmed   Last Refilled: 11/11/2010  Method Requested: Fax to Local Pharmacy Next Appointment Scheduled: none Initial call taken by: Francee Piccolo CMA Duncan Dull),  January 16, 2011 1:59 PM    Prescriptions: ALPRAZOLAM 0.5 MG TABS (ALPRAZOLAM) 1 tab by mouth once daily as needed for anxiety  #30 x 0   Entered and Authorized by:   Seymour Bars DO   Signed by:   Seymour Bars DO on 01/16/2011   Method used:   Printed then faxed to ...       [facility] [phone]* (retail)       [address]       [place], [place]  [address]       Ph: [phone] or [phone]       Fax: [phone]   RxID:   [id number]

## 2011-01-22 NOTE — Progress Notes (Signed)
Summary: Dextrostat refill  Phone Note Refill Request   Refills Requested: Medication #1:  DEXTROSTAT 10 MG  TABS 1 tab by mouth bid Initial call taken by: Payton Spark CMA,  September 26, 2010 12:57 PM    Prescriptions: DEXTROSTAT 10 MG  TABS (DEXTROAMPHETAMINE SULFATE) 1 tab by mouth bid  #60 x 0   Entered and Authorized by:   Seymour Bars DO   Signed by:   Seymour Bars DO on 09/26/2010   Method used:   Print then Give to Patient   RxID:   [id number]

## 2011-01-22 NOTE — Assessment & Plan Note (Signed)
Summary: URI   Vital Signs:  Patient profile:   50 year old female Height:      61.5 inches Weight:      147 pounds BMI:     27.42 O2 Sat:      96 % on Room air Temp:     98.0 degrees F oral Pulse rate:   84 / minute BP sitting:   130 / 85  (left arm) Cuff size:   regular  Vitals Entered By: Payton Spark CMA (November 11, 2010 1:02 PM)  O2 Flow:  Room air CC: ST, cough and congestion x 3 days.   Primary Care Provider:  Seymour Bars D.O.  CC:  ST and cough and congestion x 3 days.Jocelyn Kline  History of Present Illness: 50 yo WF presents for 3 days of sore throat, subjective fevers and  chills.  She had head congestion, postnasal drip and hoarsness.  She has a dry cough.  She is taking Theraflu and Tylenol which is helping some.  She is on Plaquenil and sulfasalazine for her RA.  She did just complete 7 days of Amoxicllin for a dental infection this wk.    Allergies: 1)  ! Levaquin  Past History:  Past Medical History: Reviewed history from 10/13/2010 and no changes required. congenital extra ureter  endometriosis [id number] NSVDs pap smears Dr Thomasena Edis 2-09 perimenopausal RA -- Dr Dareen Piano --> Dr Carolan Clines Dr Corliss Skains uro: Dr Dalstedt--> Dr Beverely Pace + PPD dating back to 1988, no treatment  Past Surgical History: Reviewed history from 04/26/2007 and no changes required.  bunionectom  laparoscopy ureter surgery 2D echo and nuclear ETT per Dr Jens Som normal LEEP 1995  Family History: Reviewed history from 10/10/2008 and no changes required. 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  Nonsmoker.  2 ETOH drinks per wk.  Works out 3-4 days/wk.  Sexually active.  Review of Systems      See HPI  Physical Exam  General:  alert, well-developed,  well-nourished, and well-hydrated.   Head:  normocephalic and atraumatic.   Eyes:  conjunctiva clear Ears:  EACs patent; TMs translucent and gray with good cone of light and bony landmarks.  Nose:  mild nasal congestion; no rhinorrhea Mouth:  o/p injected.  no exudates or vesicles hoarse voice Lungs:  Normal respiratory effort, chest expands symmetrically. Lungs are clear to auscultation, no crackles or wheezes. Heart:  Normal rate and regular rhythm. S1 and S2 normal without gallop, murmur, click, rub or other extra sounds. Skin:  color normal and no rashes.   Cervical Nodes:  mild anterior cervical chain LA   Impression & Recommendations:  Problem # 1:  VIRAL URI (ICD-465.9)  Supportive care for viral URI.  She did just complete a fuill course of Amoxicilin for a dental infection. Will treat with RX cough syrup at night + Advil Cold and flu during the day + supportive care measures. Call if not resolved in 7 days. Her updated medication list for this problem includes:    Promethazine-codeine 6.25-10 Mg/35ml Syrp (Promethazine-codeine) .Jocelyn KitchenMarland KitchenMarland KitchenMarland Kline 5 ml by mouth at bedtime as needed as needed cough  Orders: Rapid Strep (45409)  Complete Medication List: 1)  Cyanocobalamin 1000 Mcg Tabs (Cyanocobalamin) .... 2 tabs by mouth qd 2)  Dextrostat 10 Mg Tabs (Dextroamphetamine sulfate) .Jocelyn KitchenMarland KitchenMarland Kline  1 tab by mouth bid 3)  Fluoxetine Hcl 10 Mg Caps (Fluoxetine hcl) .Jocelyn Kline.. 1 tab by mouth daily 4)  Vit E  .... 1600 international units daily 5)  Borage Oil 1000 Mg Caps (Borage (borago officinalis)) .Jocelyn Kline.. 1000 mg daily 6)  Plaquenil 200 Mg Tabs (Hydroxychloroquine sulfate) .... Take 1 tab by mouth two times a day 7)  Sulfazine 500 Mg Tabs (Sulfasalazine) .... Take 1 tab by mouth two times a day 8)  Promethazine-codeine 6.25-10 Mg/13ml Syrp (Promethazine-codeine) .... 5 ml by mouth at bedtime as needed as needed cough 9)  Alprazolam 0.5 Mg Tabs (Alprazolam) .Jocelyn Kline.. 1 tab by mouth once daily as needed for  anxiety  Patient Instructions: 1)  For viral upper respiratory infection: 2)  Use Advil Cold and Flu for symptomatic relief during the day and RX cough syrup at night. 3)  Drink plenty of clear fluids and rest. 4)  Call if not improved after 7 days. Prescriptions: ALPRAZOLAM 0.5 MG TABS (ALPRAZOLAM) 1 tab by mouth once daily as needed for anxiety  #30 x 0   Entered and Authorized by:   Seymour Bars DO   Signed by:   Seymour Bars DO on 11/11/2010   Method used:   Printed then faxed to ...       [facility] [phone]* (retail)       [address]       [place], [place]  [address]       Ph: [phone] or [phone]       Fax: [phone]   RxID:   [id number] PROMETHAZINE-CODEINE 6.25-10 MG/5ML SYRP (PROMETHAZINE-CODEINE) 5 ml by mouth at bedtime as needed as needed cough  #100 ml x 0   Entered and Authorized by:   Seymour Bars DO   Signed by:   Seymour Bars DO on 11/11/2010   Method used:   Printed then faxed to ...       [facility] [phone]* (retail)       [address]       [place], [place]  [address]       Ph: [phone] or [phone]       Fax: [phone]   RxID:   [id number]    Orders Added: 1)  Rapid Strep [42706] 2)  Est. Patient Level III [23762]    Laboratory Results    Other Tests  Rapid Strep: negative

## 2011-01-22 NOTE — Progress Notes (Signed)
  Phone Note Refill Request Message from:  Patient on July 30, 2010 9:42 AM  Refills Requested: Medication #1:  DEXTROSTAT 10 MG  TABS 1 tab by mouth bid Initial call taken by: Payton Spark CMA,  July 30, 2010 9:42 AM    Prescriptions: DEXTROSTAT 10 MG  TABS (DEXTROAMPHETAMINE SULFATE) 1 tab by mouth bid  #60 x 0   Entered and Authorized by:   Seymour Bars DO   Signed by:   Seymour Bars DO on 07/30/2010   Method used:   Print then Give to Patient   RxID:   [id number]

## 2011-01-22 NOTE — Letter (Signed)
Summary: [facility]  [facility]   Imported By: Lanelle Bal 12/27/2009 09:49:07  _____________________________________________________________________  External Attachment:    Type:   Image     Comment:   External Document

## 2011-01-22 NOTE — Letter (Signed)
Summary: [facility]  [facility]   Imported By: Lanelle Bal 04/01/2010 12:17:58  _____________________________________________________________________  External Attachment:    Type:   Image     Comment:   External Document

## 2011-01-22 NOTE — Progress Notes (Signed)
Summary: Dextrostat refill  Phone Note Refill Request   Refills Requested: Medication #1:  DEXTROSTAT 10 MG  TABS 1 tab by mouth bid Initial call taken by: Payton Spark CMA,  May 05, 2010 4:46 PM    Prescriptions: DEXTROSTAT 10 MG  TABS (DEXTROAMPHETAMINE SULFATE) 1 tab by mouth bid  #60 x 0   Entered and Authorized by:   Seymour Bars DO   Signed by:   Seymour Bars DO on 05/05/2010   Method used:   Print then Give to Patient   RxID:   [id number]   Appended Document: Dextrostat refill Pt aware

## 2011-01-28 NOTE — Progress Notes (Signed)
Summary: [facility]-Refill Fluoxetine  Phone Note Refill Request Call back at Home Phone [phone] Message from:  Patient on January 20, 2011 8:57 AM  Refills Requested: Medication #1:  FLUOXETINE HCL 10 MG CAPS 1 tab by mouth daily   Dosage confirmed as above?Dosage Confirmed   Last Refilled: 09/22/2010 last office visit 11/11/10   Method Requested: Electronic Next Appointment Scheduled: none Initial call taken by: Francee Piccolo CMA Duncan Dull),  January 20, 2011 8:58 AM    Prescriptions: FLUOXETINE HCL 10 MG CAPS (FLUOXETINE HCL) 1 tab by mouth daily  #30.0 Capsule x 2   Entered by:   Payton Spark CMA   Authorized by:   Seymour Bars DO   Signed by:   Payton Spark CMA on 01/20/2011   Method used:   Electronically to        [facility] [phone]* (retail)       [address]       [place], [place]  [address]       Ph: [phone] or [phone]       Fax: [phone]   RxID:   [id number]

## 2011-03-16 ENCOUNTER — Telehealth: Payer: Self-pay | Admitting: *Deleted

## 2011-03-16 DIAGNOSIS — N644 Mastodynia: Secondary | ICD-10-CM

## 2011-03-16 NOTE — Telephone Encounter (Signed)
Pt [name] stating she needs an order for diagnostic mamm[facility] [place]. She had normal mammo scheduled but when she told them she was having breast pain they said she needed diagnostic order. Please advise.

## 2011-03-16 NOTE — Telephone Encounter (Signed)
I put in her order.

## 2011-03-17 NOTE — Telephone Encounter (Signed)
Order printed and faxed.

## 2011-04-02 ENCOUNTER — Encounter: Payer: Self-pay | Admitting: Family Medicine

## 2011-04-16 ENCOUNTER — Encounter: Payer: Self-pay | Admitting: Family Medicine

## 2011-04-22 ENCOUNTER — Ambulatory Visit (INDEPENDENT_AMBULATORY_CARE_PROVIDER_SITE_OTHER): Payer: BC Managed Care – PPO | Admitting: Family Medicine

## 2011-04-22 ENCOUNTER — Encounter: Payer: Self-pay | Admitting: Family Medicine

## 2011-04-22 VITALS — BP 115/79 | HR 75 | Temp 97.9°F | Ht 61.5 in | Wt 145.0 lb

## 2011-04-22 DIAGNOSIS — H579 Unspecified disorder of eye and adnexa: Secondary | ICD-10-CM

## 2011-04-22 DIAGNOSIS — R6889 Other general symptoms and signs: Secondary | ICD-10-CM

## 2011-04-22 MED ORDER — OLOPATADINE HCL 0.1 % OP SOLN: OPHTHALMIC | Status: AC

## 2011-04-22 MED ORDER — DEXTROAMPHETAMINE SULFATE 10 MG PO TABS: 10.0000 mg | ORAL_TABLET | Freq: Two times a day (BID) | ORAL | Status: DC

## 2011-04-22 NOTE — Progress Notes (Signed)
  Subjective:    Patient ID: Jocelyn Kline, female    DOB: 1961/03/19, 50 y.o.   MRN: [id number]  HPI 50 yo WF prsents for itching and irritation on the inner corner of her L eye x 3 wks.  No getting any better.  She has some drainage in the morning.  Denies blurry vision.  Wears contacts.  Denies pain with blinking.  Her eye feels watery and itchy in the corners.  Not putting anything on it.  She feels like her L lids are a little swollen.  BP 115/79  Pulse 75  Temp(Src) 97.9 F (36.6 C) (Oral)  Ht 5' 1.5" (1.562 m)  Wt 145 lb (65.772 kg)  BMI 26.95 kg/m2  SpO2 96%   Review of Systems as per HPI   Objective:   Physical Exam redness with localized edema over the inner canthus of the L eye.  No scleral injection, watering, discharge or lid edema.  EOMI.         Assessment & Plan:  Redness/ irritation inner canthus -- ? Localized allergic dermatitis with pruritis.  Will treat with both patanol drops and erythromycin ointment.  Call if not improving in 48 hrs.  Will get her in with [facility] if needed.

## 2011-04-22 NOTE — Patient Instructions (Signed)
Use both RX drops and Rx ointment for redness. Call if not improving by Fri Afternoon.

## 2011-05-05 ENCOUNTER — Other Ambulatory Visit: Payer: Self-pay | Admitting: Family Medicine

## 2011-05-08 NOTE — Assessment & Plan Note (Signed)
[facility]                              CARDIOLOGY OFFICE NOTE   NAME:Jocelyn Kline, Jocelyn Kline                     MRN:          [id number]  DATE:09/08/2006                            DOB:          23-Oct-1961    The patient is a very pleasant 50 year old female with no prior cardiac  history who I am asked to evaluate for chest pain, palpitations and an  abnormal echocardiogram.  She has no prior cardiac history, typically does  not have dyspnea on exertion, orthopnea, PND, pedal edema, syncope or  exertional chest pain.  Over the past 1 year she has noticed occasional mild  dyspnea on exertion. She has also had pain in her left upper chest that  radiates down her left arm. Typically worsens with lying on her left side.  It is non exertional nor is it pleuritic or positional or related to food.  There is no associated nausea, vomiting, shortness of breath or diaphoresis.  She has also had occasional palpitations predominantly when she feels  anxious. She also noticed and elevation in her heart rate after drinking  wine. She had an echocardiogram performed and interpreted on September 02, 2006 in [place]. She was found to have normal LV function with an ejection  fraction of 55-60%. The report states there is moderate concentric left  ventricular hypertrophy. However in reviewing the measurements, the  posterior wall is documented at 0.9 cm and the septum is documented at 0.7  cm.  Because of the above we were asked to further evaluate.   MEDICATIONS:  1. Dextroamphetamine.  2. Ginkgo.  3. Urised.  4. Vitamin B12.   ALLERGIES:  No known drug allergies.   SOCIAL HISTORY:  She does not smoke. She occasionally consumes alcohol (one  glass of wine every other evening). There is no history of drug use.   FAMILY HISTORY:  Negative for coronary artery disease or any other cardiac  history.   PAST MEDICAL HISTORY:  There is no diabetes mellitus,  hypertension or  hyperlipidemia. She has no previous cardiac disease as outlined in the HPI.  She does have a history of attention deficit disorder and anxiety. She has  had prior endometriosis. She has had prior surgery on her kidney as a child.  She also had a tonsillectomy.   REVIEW OF SYSTEMS:  There are no headaches, fevers, chills. There is no  productive cough or hemoptysis. There is no dysphagia or odynophagia,  melena, hematochezia. There is no dysuria or hematuria. There is seizure  activity.  There is no orthopnea, PND or pedal edema. The remaining systems  are negative.   PHYSICAL EXAM TODAY:  GENERAL:  She is well-developed, well-nourished, in no  acute distress.  SKIN:  Is warm and dry.  NEURO:  She does not appear depressed and there is no peripheral clubbing.  HEENT:  Unremarkable with normal eyelids.  NECK:  Supple.  LUNGS:  Clear bilaterally, I cannot appreciate bruits. There is no jugular  venous distension and there is no thyromegaly noted.  CHEST:  Clear to auscultation with normal expansion.  CARDIOVASCULAR  EXAM:  Reveals a regular rate and rhythm with normal S1 and  S2. There are no murmurs, rubs, or gallops noted.  ABDOMINAL EXAM:  Nontender, positive bowel sounds, no hepatosplenomegaly, no  mass appreciated. There is no abdominal bruit. She has 2+ femoral pulses  bilaterally and no bruits.  EXTREMITIES:  Show no edema and I can palpate no cords. She has 2+ dorsalis  pedis pulses bilaterally.  NEUROLOGIC EXAM:  Grossly intact.   Electrocardiogram today shows a normal sinus rhythm at a rate of 86. The  axis is normal. There are no significant ST changes noted. There is no  evidence of left ventricular hypertrophy.   DIAGNOSES:  1. Atypical chest pain.  2. Palpitations.  3. Question left ventricular hypertrophy.  4. History of attention deficit disorder.   PLAN:  [name] presents with complaints of atypical chest pain and is  concerned about the  possibility of left ventricular hypertrophy on her  echocardiogram.  The report does state moderate concentric left ventricular  hypertrophy but her septal and posterior wall dimensions do not support this  where otherwise stated in the report. We will plan to proceed with a stress-  echocardiogram both to rule out ischemia, although I think this is unlikely.  We will also measure her left ventricular walls at that time. If she does  have left ventricular hypertrophy then we will consider a ferritin level to  rule out infiltrative disorder, and a TSH.  We will also schedule her to  have an event monitor to evaluate her palpitations. She is also concerned  about the possibility that her attention deficit medications are  contributing.  We can review this if indeed she does have left ventricular  hypertrophy.   We will see her back in 2 weeks to review the above information.                              Madolyn Frieze Jens Som, MD, [facility]    BSC/MedQ  DD:  09/08/2006  DT:  09/09/2006  Job #:  [id number]   cc:   Seymour Bars, D.O.

## 2011-05-13 ENCOUNTER — Encounter: Payer: Self-pay | Admitting: Family Medicine

## 2011-08-25 ENCOUNTER — Other Ambulatory Visit: Payer: Self-pay | Admitting: *Deleted

## 2011-08-25 MED ORDER — DEXTROAMPHETAMINE SULFATE 10 MG PO TABS: 10.0000 mg | ORAL_TABLET | Freq: Two times a day (BID) | ORAL | Status: DC

## 2011-08-25 MED ORDER — FLUOXETINE HCL 10 MG PO CAPS: 10.0000 mg | ORAL_CAPSULE | Freq: Every day | ORAL | Status: DC

## 2011-12-17 ENCOUNTER — Other Ambulatory Visit: Payer: Self-pay | Admitting: Family Medicine

## 2012-02-22 ENCOUNTER — Other Ambulatory Visit: Payer: Self-pay | Admitting: *Deleted

## 2012-02-22 MED ORDER — DEXTROAMPHETAMINE SULFATE 10 MG PO TABS: 10.0000 mg | ORAL_TABLET | Freq: Two times a day (BID) | ORAL | Status: DC

## 2012-04-30 ENCOUNTER — Other Ambulatory Visit: Payer: Self-pay | Admitting: Family Medicine

## 2012-06-07 ENCOUNTER — Encounter: Payer: Self-pay | Admitting: Family Medicine

## 2012-06-07 ENCOUNTER — Ambulatory Visit (INDEPENDENT_AMBULATORY_CARE_PROVIDER_SITE_OTHER): Payer: BC Managed Care – PPO | Admitting: Family Medicine

## 2012-06-07 VITALS — BP 133/84 | HR 94 | Temp 98.4°F | Ht 61.5 in | Wt 146.0 lb

## 2012-06-07 DIAGNOSIS — R1310 Dysphagia, unspecified: Secondary | ICD-10-CM

## 2012-06-07 MED ORDER — ALPRAZOLAM 0.5 MG PO TABS: 0.5000 mg | ORAL_TABLET | Freq: Every evening | ORAL | Status: DC | PRN

## 2012-06-07 MED ORDER — FLUOXETINE HCL 20 MG PO CAPS: 20.0000 mg | ORAL_CAPSULE | Freq: Every day | ORAL | Status: DC

## 2012-06-07 NOTE — Patient Instructions (Signed)
Dysphagia  Swallowing problems (dysphagia) occur when solids and liquids seem to stick in your throat on the way down to your stomach, or the food takes longer to get to the stomach. Other symptoms (problems) include regurgitating (burping) up food, noises coming from the throat, chest discomfort with swallowing, and a feeling of fullness in the throat when swallowing. When blockage in the throat is complete it may be associated with drooling.  CAUSES  There are many causes of swallowing difficulties and the following is generalized information regarding a number of reasons for this problem. Problems with swallowing may occur because of problems with the muscles. The food cannot be propelled in the usual manner into the stomach. There may be ulcers, scar tissueor inflammation (soreness) in the esophagus (the food tube from the mouth to the stomach) which blocks food from passing normally into the stomach. Causes of inflammation include acid reflux from the stomach into the esophagus. Inflammation can also be caused by the herpes simplex virus, Candida (yeast), radiation (as with treatment of cancer), or inflammation from medications not taken with adequate fluids to wash them down into the stomach. There may be nerve problems so signals cannot be sent adequately telling the muscles of the esophagus to contract and move the food along. Achalasia is a rare disorder of the esophagus in which muscular contractions of the esophagus are uncoordinated. Globus hystericus is a relatively common problem in young females in which there is a sense of an obstruction or difficulty in swallowing, but in which no abnormalities can be found. This problem usually improves over time with reassurance and testing to rule out other causes.  DIAGNOSIS  A number of tests will help your caregiver know what is the cause of your swallowing problems. These tests may include a barium swallow in which x-rays are taken while you are drinking a  liquid that outlines the lining of the esophagus on x-ray. If the stomach and small bowel are also studied in this manner it is called an upper gastrointestinal exam (UGI). Endoscopy may be done in which your caregiver examines your throat, esophagus, stomach and small bowel with an instrument like a small flexible telescope. Motility studies which measure the effectiveness and coordination of the muscular contractions of the esophagus may also be done.  TREATMENT  The treatment of swallowing problems are many, varying from medications to surgical treatment. The treatment varies with the type of problem found. Your caregiver will discuss your results and treatment with you. If swallowing problems are severe the long term problems which may occur include: malnutrition, pneumonia (from food going into the breathing tubes called trachea and bronchi), and an increase in tumors (lumps) of the esophagus.  SEEK IMMEDIATE MEDICAL CARE IF:   Food or other object becomes lodged in your throat or esophagus and won't move.  Document Released: 12/04/2000 Document Revised: 11/26/2011 Document Reviewed: 07/25/2008  [employer] Patient Information 2012 [employer].

## 2012-06-07 NOTE — Progress Notes (Signed)
  Subjective:    Patient ID: Jocelyn Kline, female    DOB: 08/09/1961, 51 y.o.   MRN: [id number]  HPI 4 year relationship ended about 2 weeks ago and really stressed.  Having trouble sleeping since then. Says has a job that she really has th function well and has been challenging to make sure she is on top of it mentally and emotionally.  Has used xanax years ago and was helpfully. Does have some support in her life  When swallows a piece of meet and will become lodge in her upper chest and it will finally pass or she will vomit it. Had a bad episode over the weeknd and really thought she was going to choke. Occ heartburn but not often.    Review of Systems     Objective:   Physical Exam  Constitutional: She is oriented to person, place, and time. She appears well-developed and well-nourished.  HENT:  Head: Normocephalic and atraumatic.  Mouth/Throat: Oropharynx is clear and moist.  Cardiovascular: Normal rate, regular rhythm and normal heart sounds.   Pulmonary/Chest: Effort normal and breath sounds normal.  Abdominal: Soft. Bowel sounds are normal. She exhibits no distension and no mass. There is no tenderness. There is no rebound and no guarding.  Neurological: She is alert and oriented to person, place, and time.  Skin: Skin is warm and dry.  Psychiatric: She has a normal mood and affect. Her behavior is normal.          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  She does plan on starting counseling which I think would be fantastic. The meantime we'll increase her fluoxetine to 20 mg and also her back in 4 weeks. I did give her a small quantity of Xanax to use at bedtime to help her calm her mind and so she can go to sleep. 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 She has used it previously several years ago. Hopefully she will be doing much better when I see her back in one month.  Dysphagia - will refer to gastroenterology for evaluation with  endoscopy. Most likely she has an esophageal stricture. Handout given. I did recommend she start some Prilosec OTC said she has had some occasional heartburn symptoms as well as sometimes this can be a contributing factor to a stricture.

## 2012-06-16 ENCOUNTER — Telehealth: Payer: Self-pay | Admitting: *Deleted

## 2012-06-16 NOTE — Telephone Encounter (Signed)
Please advise on this. I don't feel comfortable selecting a dose for this pt or to even ask the patient.

## 2012-06-16 NOTE — Telephone Encounter (Signed)
First of all I cannot find documentation that she has used seraquil in the past. If she wants the seraquil if she knows the dosage prescribed up to 400 mg if she does not know the dosage that'll have to be for 100 mg one tablet at night. She may have 30 no refills until she sees or gets approval for this medication from Dr. Linford Arnold.

## 2012-06-16 NOTE — Telephone Encounter (Signed)
seroquel is a strong sleep aid to start out with. Has she tried trazodone?

## 2012-06-16 NOTE — Telephone Encounter (Signed)
Pt seen Dr. Linford Arnold last week and states she did give her some Xanax but it makes her hyper instead of helping her relax. She would like to know if we can send Seroquel PRN for sleep. States she doesn't want to try anything like Ambien. Please advise.

## 2012-06-17 MED ORDER — TRAZODONE 25 MG HALF TABLET: 25.0000 mg | ORAL_TABLET | Freq: Every day | ORAL | Status: DC

## 2012-06-17 NOTE — Telephone Encounter (Signed)
Pt informed

## 2012-06-17 NOTE — Telephone Encounter (Signed)
Start with half time may increase to full tab if half time not effective. Follow up with Dr. Linford Arnold at scheduled office visit.

## 2012-06-17 NOTE — Telephone Encounter (Signed)
Pt states that she will try the trazodone but would like to do the lowest dose b/c she is sensitive to meds.

## 2012-07-01 ENCOUNTER — Ambulatory Visit: Payer: BC Managed Care – PPO | Admitting: Family Medicine

## 2012-07-08 ENCOUNTER — Ambulatory Visit (INDEPENDENT_AMBULATORY_CARE_PROVIDER_SITE_OTHER): Payer: BC Managed Care – PPO | Admitting: Family Medicine

## 2012-07-08 ENCOUNTER — Encounter: Payer: Self-pay | Admitting: Family Medicine

## 2012-07-08 VITALS — BP 131/87 | HR 77 | Ht 61.5 in | Wt 143.0 lb

## 2012-07-08 DIAGNOSIS — Z13220 Encounter for screening for lipoid disorders: Secondary | ICD-10-CM

## 2012-07-08 DIAGNOSIS — Z5181 Encounter for therapeutic drug level monitoring: Secondary | ICD-10-CM

## 2012-07-08 DIAGNOSIS — G4700 Insomnia, unspecified: Secondary | ICD-10-CM

## 2012-07-08 DIAGNOSIS — R1310 Dysphagia, unspecified: Secondary | ICD-10-CM

## 2012-07-08 MED ORDER — DEXTROAMPHETAMINE SULFATE 10 MG PO TABS: 10.0000 mg | ORAL_TABLET | Freq: Two times a day (BID) | ORAL | Status: DC

## 2012-07-08 MED ORDER — DEXTROAMPHETAMINE SULFATE 10 MG PO TABS: 10.0000 mg | ORAL_TABLET | Freq: Two times a day (BID) | ORAL | Status: AC

## 2012-07-08 MED ORDER — RAMELTEON 8 MG PO TABS: 8.0000 mg | ORAL_TABLET | Freq: Every day | ORAL | Status: DC

## 2012-07-08 NOTE — Progress Notes (Signed)
  Subjective:    Patient ID: Jocelyn Kline, female    DOB: [birth year], 51 y.o.   MRN: [id number]  HPI Dysphagia- We had her scheduled with GI on 7/15 but she said conflicted with her schedule and has not rescheduled yet.  Had another episode and actually vomited. The prilosec has helped.   Insomnia - Trazodone made her feel groggy and gave her a HA. Even tried half a tab. Doing well on the fluoxetine. No problem falling asleep but will wake up   Review of Systems     Objective:   Physical Exam  Constitutional: She is oriented to person, place, and time. She appears well-developed and well-nourished.  HENT:  Head: Normocephalic and atraumatic.  Cardiovascular: Normal rate, regular rhythm and normal heart sounds.   Pulmonary/Chest: Effort normal and breath sounds normal.  Neurological: She is alert and oriented to person, place, and time.  Skin: Skin is warm and dry.  Psychiatric: She has a normal mood and affect. Her behavior is normal.          Assessment & Plan:  Dypsphagia - I strongly encouraged her to go ahead and reschedule her appointment with GI since she had to cancel the first one. She really needs to have an endoscopy and probably a dilatation for possible esophageal stricture. She is also 51 years old I encouraged her to ask about getting her colonoscopy at the same time as well. Continue the Prilosec since it has helped her symptoms.  Insmonia - we did discuss different options. She had grogginess and headache with trazodone. She had hallucinations with Ambien in the past. I would like to try Rozerem. Will likely need a prior authorization. Depending on the expense we could also consider Lunesta as another option. Continue to work on sleep hygiene. She could also try valerian root capsules. She said she did do this in the past at one time but did remember if it helped or not.  Declined Tdap today but says she will get it next time  She is also due for screening CMP and  lipid panel. Given lab slip today and encouraged her to the lab fasting when she is able.

## 2012-08-16 ENCOUNTER — Telehealth: Payer: Self-pay | Admitting: Family Medicine

## 2012-08-16 NOTE — Telephone Encounter (Signed)
Call pt: Please call patient. Normal mammogram.  Repeat in 1 year. See if she is interested in referral for screening colonoscopy now that she is 50.

## 2012-08-17 NOTE — Telephone Encounter (Signed)
Left message on vm

## 2012-08-19 ENCOUNTER — Encounter: Payer: Self-pay | Admitting: Family Medicine

## 2012-08-19 ENCOUNTER — Ambulatory Visit (INDEPENDENT_AMBULATORY_CARE_PROVIDER_SITE_OTHER): Payer: BC Managed Care – PPO | Admitting: Family Medicine

## 2012-08-19 VITALS — BP 143/91 | HR 80 | Wt 145.0 lb

## 2012-08-19 DIAGNOSIS — R030 Elevated blood-pressure reading, without diagnosis of hypertension: Secondary | ICD-10-CM

## 2012-08-19 DIAGNOSIS — R7611 Nonspecific reaction to tuberculin skin test without active tuberculosis: Secondary | ICD-10-CM

## 2012-08-19 DIAGNOSIS — M069 Rheumatoid arthritis, unspecified: Secondary | ICD-10-CM

## 2012-08-19 DIAGNOSIS — G4700 Insomnia, unspecified: Secondary | ICD-10-CM

## 2012-08-19 NOTE — Progress Notes (Signed)
  Subjective:    Patient ID: Jocelyn Kline, female    DOB: 08/04/1961, 51 y.o.   MRN: [id number]  HPI   Insominia - Getting about 5.5-5 hours of sleep. Doing a little better.  Has only tried the rozerim a couple of times.  No hallucinations.    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 He rheumatologist had recommend to do a course of INH before doing the injectables.  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  Elevated blood pressure-she denies taking any NSAIDs today. Denies any decongestants. She did eat high salt meal last night. No chest pain or shortness of breath. She says she does feel extremely fatigued today.   Review of Systems     Objective:   Physical Exam  Constitutional: She is oriented to person, place, and time. She appears well-developed and well-nourished.  HENT:  Head: Normocephalic and atraumatic.  Cardiovascular: Normal rate, regular rhythm and normal heart sounds.   Pulmonary/Chest: Effort normal and breath sounds normal.  Neurological: She is alert and oriented to person, place, and time.  Skin: Skin is warm and dry.  Psychiatric: She has a normal mood and affect. Her behavior is normal.          Assessment & Plan:  Insnomnia - Rec check on coupons online. I explained the Rozerem Needs to be taken on daily basis to work most effectively because of that; receptors in the brain. Works very different from Ambien which can be used primarily on when necessary basis. So far the she has done well and has not had any hallucinations like she did with Ambien in the past.  Tdap vaccine declined today.  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 She has had a negative chest x-ray in the past.  Elevated blood pressure-like her to come by for nurse check in 2-3 weeks for blood pressure. Avoid NSAIDs, decongestants and high salt foods. Avoid caffeine.

## 2012-09-05 ENCOUNTER — Ambulatory Visit: Payer: BC Managed Care – PPO | Admitting: Internal Medicine

## 2012-09-06 ENCOUNTER — Ambulatory Visit: Payer: BC Managed Care – PPO | Admitting: Internal Medicine

## 2012-09-09 ENCOUNTER — Encounter: Payer: Self-pay | Admitting: Family Medicine

## 2012-09-13 ENCOUNTER — Encounter: Payer: Self-pay | Admitting: Family Medicine

## 2012-10-04 ENCOUNTER — Ambulatory Visit: Payer: BC Managed Care – PPO | Admitting: Internal Medicine

## 2012-12-06 ENCOUNTER — Encounter: Payer: Self-pay | Admitting: Family Medicine

## 2012-12-06 ENCOUNTER — Ambulatory Visit (INDEPENDENT_AMBULATORY_CARE_PROVIDER_SITE_OTHER): Payer: BC Managed Care – PPO | Admitting: Family Medicine

## 2012-12-06 ENCOUNTER — Ambulatory Visit (INDEPENDENT_AMBULATORY_CARE_PROVIDER_SITE_OTHER): Payer: BC Managed Care – PPO

## 2012-12-06 ENCOUNTER — Encounter: Payer: Self-pay | Admitting: *Deleted

## 2012-12-06 VITALS — BP 133/87 | HR 78 | Temp 98.1°F | Resp 16 | Wt 148.0 lb

## 2012-12-06 DIAGNOSIS — K219 Gastro-esophageal reflux disease without esophagitis: Secondary | ICD-10-CM

## 2012-12-06 DIAGNOSIS — J40 Bronchitis, not specified as acute or chronic: Secondary | ICD-10-CM

## 2012-12-06 DIAGNOSIS — R0789 Other chest pain: Secondary | ICD-10-CM

## 2012-12-06 DIAGNOSIS — J329 Chronic sinusitis, unspecified: Secondary | ICD-10-CM

## 2012-12-06 LAB — COMPREHENSIVE METABOLIC PANEL WITH GFR
ALT: 100 U/L — ABNORMAL HIGH (ref 0–35)
AST: 74 U/L — ABNORMAL HIGH (ref 0–37)
Albumin: 4.8 g/dL (ref 3.5–5.2)
Alkaline Phosphatase: 55 U/L (ref 39–117)
BUN: 13 mg/dL (ref 6–23)
CO2: 25 meq/L (ref 19–32)
Calcium: 10.3 mg/dL (ref 8.4–10.5)
Chloride: 104 meq/L (ref 96–112)
Creat: 0.91 mg/dL (ref 0.50–1.10)
Glucose, Bld: 86 mg/dL (ref 70–99)
Potassium: 5 meq/L (ref 3.5–5.3)
Sodium: 139 meq/L (ref 135–145)
Total Bilirubin: 0.6 mg/dL (ref 0.3–1.2)
Total Protein: 7.3 g/dL (ref 6.0–8.3)

## 2012-12-06 LAB — CBC WITH DIFFERENTIAL/PLATELET
Basophils Absolute: 0.1 10*3/uL (ref 0.0–0.1)
Basophils Relative: 2 % — ABNORMAL HIGH (ref 0–1)
Eosinophils Absolute: 0.4 10*3/uL (ref 0.0–0.7)
Hemoglobin: 15.3 g/dL — ABNORMAL HIGH (ref 12.0–15.0)
MCH: 31.9 pg (ref 26.0–34.0)
MCHC: 33.8 g/dL (ref 30.0–36.0)
Monocytes Relative: 15 % — ABNORMAL HIGH (ref 3–12)
Neutro Abs: 2.3 10*3/uL (ref 1.7–7.7)
Neutrophils Relative %: 39 % — ABNORMAL LOW (ref 43–77)
Platelets: 323 10*3/uL (ref 150–400)
RDW: 13.7 % (ref 11.5–15.5)

## 2012-12-06 LAB — HEPATIC FUNCTION PANEL
ALT: 100 U/L — AB (ref 7–35)
Alkaline Phosphatase: 55 U/L (ref 25–125)
Bilirubin, Total: 0.6 mg/dL

## 2012-12-06 LAB — BASIC METABOLIC PANEL: Glucose: 86 mg/dL

## 2012-12-06 MED ORDER — AMOXICILLIN-POT CLAVULANATE 875-125 MG PO TABS: 1.0000 | ORAL_TABLET | Freq: Two times a day (BID) | ORAL | Status: DC

## 2012-12-06 MED ORDER — MELOXICAM 15 MG PO TABS: 15.0000 mg | ORAL_TABLET | Freq: Every day | ORAL | Status: DC

## 2012-12-06 NOTE — Progress Notes (Signed)
Subjective:    Patient ID: Jocelyn Kline, female    DOB: [birth year], 51 y.o.   MRN: [id number]  Chest Pain  This is a new problem. The current episode started 1 to 4 weeks ago (Patient reports heaviness in chest discomfort for about 2 weeks and coughing for about a week with productive cough.). The onset quality is sudden. The problem occurs constantly. The problem has been waxing and waning. The pain is present in the lateral region. The pain is at a severity of 6/10 (Pain has ranged from 8-9/10. Today it is a 6. The). The pain is moderate. The quality of the pain is described as stabbing, heavy and pressure. The pain radiates to the left arm, left neck and right neck. Associated symptoms include a cough. The cough has no precipitants. The cough is productive (In the a.m. cough is extremely worrisome and has a hacking cough nature). Color change with cough: green and thick. Nothing relieves the cough. The cough is worsened by a supine position. Risk factors include obesity and sedentary lifestyle.  Her past medical history is significant for anxiety/panic attacks and connective tissue disease.  Pertinent negatives for past medical history include no aortic aneurysm, no aortic dissection, no arrhythmia, no bicuspid aortic valve, no CAD, no cancer, no congenital heart disease, no COPD, no diabetes, no hyperlipidemia and no hypertension. Past medical history comments: HX of fatty liver  Her family medical history is significant for hypertension in family.  Pertinent negatives for family medical history include: family history of aortic dissection, no CAD in family, no diabetes in family, no heart disease in family, no early MI in family, no stroke in family and no sudden death in family. Prior diagnostic workup includes chest x-ray and stress echo (more than 2 years).  Cough Associated symptoms include chest pain. HX of fatty liver      Review of Systems  Respiratory: Positive for cough.    Cardiovascular: Positive for chest pain.  All other systems reviewed and are negative.      BP 133/87  Pulse 78  Temp 98.1 F (36.7 C)  Resp 16  Wt 148 lb (67.132 kg) Objective:   Physical Exam  Vitals reviewed. Constitutional: She is oriented to person, place, and time. She appears well-developed and well-nourished.  HENT:  Head: Normocephalic.  Nose: Mucosal edema present. Right sinus exhibits maxillary sinus tenderness. Left sinus exhibits maxillary sinus tenderness.  Mouth/Throat: She does not have dentures. Normal dentition. No dental caries.  Neck: Normal range of motion. Neck supple.  Cardiovascular: Normal rate, regular rhythm and normal heart sounds.  Exam reveals no friction rub.   No murmur heard.      Tenderness over the L chest wall/ribs  Pulmonary/Chest: Effort normal and breath sounds normal. No respiratory distress. She has no wheezes.  Neurological: She is alert and oriented to person, place, and time.  Skin: Skin is warm and dry.  Psychiatric: She has a normal mood and affect.   EKG sinus ryhthm     Assessment & Plan:  #1 chest pain/costochondritis. EKG was fine. We'll try to get echo stress test with local cardiologist Dr. Jens Som. Mobic 15 mg one tablet a day and go to the emergency room if chest pain gets worse or symptoms persist. Also will get a d-dimer to make sure PE is not a problem.  #2 bronchitis/sinusitis may be from reflux since this has only been occurring for about a week. Chest x-ray and Augmentin a 75 twice a day  for 10 days.  #3 possible reflux. Even though patient is on a PPI she does not take it everyday. I recommend she elevate the head of her bed and take her PPI every day. I have returned followup with Dr. Linford Arnold in 3 weeks.

## 2012-12-06 NOTE — Patient Instructions (Signed)
Costochondritis Costochondritis (Tietze syndrome), or costochondral separation, is a swelling and irritation (inflammation) of the tissue (cartilage) that connects your ribs with your breastbone (sternum). It may occur on its own (spontaneously), through damage caused by an accident (trauma), or simply from coughing or minor exercise. It may take up to 6 weeks to get better and longer if you are unable to be conservative in your activities. HOME CARE INSTRUCTIONS   Avoid exhausting physical activity. Try not to strain your ribs during normal activity. This would include any activities using chest, belly (abdominal), and side muscles, especially if heavy weights are used.  Use ice for 15 to 20 minutes per hour while awake for the first 2 days. Place the ice in a plastic bag, and place a towel between the bag of ice and your skin.  Only take over-the-counter or prescription medicines for pain, discomfort, or fever as directed by your caregiver. SEEK IMMEDIATE MEDICAL CARE IF:   Your pain increases or you are very uncomfortable.  You have a fever.  You develop difficulty with your breathing.  You cough up blood.  You develop worse chest pains, shortness of breath, sweating, or vomiting.  You develop new, unexplained problems (symptoms). MAKE SURE YOU:   Understand these instructions.  Will watch your condition.  Will get help right away if you are not doing well or get worse. Document Released: 09/16/2005 Document Revised: 02/29/2012 Document Reviewed: 07/25/2008 [facility] Patient Information 2013 [place], [place]. Gastroesophageal Reflux Disease, Adult Gastroesophageal reflux disease (GERD) happens when acid from your stomach flows up into the esophagus. When acid comes in contact with the esophagus, the acid causes soreness (inflammation) in the esophagus. Over time, GERD may create small holes (ulcers) in the lining of the esophagus. CAUSES   Increased body weight. This puts pressure  on the stomach, making acid rise from the stomach into the esophagus.  Smoking. This increases acid production in the stomach.  Drinking alcohol. This causes decreased pressure in the lower esophageal sphincter (valve or ring of muscle between the esophagus and stomach), allowing acid from the stomach into the esophagus.  Late evening meals and a full stomach. This increases pressure and acid production in the stomach.  A malformed lower esophageal sphincter. Sometimes, no cause is found. SYMPTOMS   Burning pain in the lower part of the mid-chest behind the breastbone and in the mid-stomach area. This may occur twice a week or more often.  Trouble swallowing.  Sore throat.  Dry cough.  Asthma-like symptoms including chest tightness, shortness of breath, or wheezing. DIAGNOSIS  Your caregiver may be able to diagnose GERD based on your symptoms. In some cases, X-rays and other tests may be done to check for complications or to check the condition of your stomach and esophagus. TREATMENT  Your caregiver may recommend over-the-counter or prescription medicines to help decrease acid production. Ask your caregiver before starting or adding any new medicines.  HOME CARE INSTRUCTIONS   Change the factors that you can control. Ask your caregiver for guidance concerning weight loss, quitting smoking, and alcohol consumption.  Avoid foods and drinks that make your symptoms worse, such as:  Caffeine or alcoholic drinks.  Chocolate.  Peppermint or mint flavorings.  Garlic and onions.  Spicy foods.  Citrus fruits, such as oranges, lemons, or limes.  Tomato-based foods such as sauce, chili, salsa, and pizza.  Fried and fatty foods.  Avoid lying down for the 3 hours prior to your bedtime or prior to taking a nap.  Eat small, frequent meals instead of large meals.  Wear loose-fitting clothing. Do not wear anything tight around your waist that causes pressure on your  stomach.  Raise the head of your bed 6 to 8 inches with wood blocks to help you sleep. Extra pillows will not help.  Only take over-the-counter or prescription medicines for pain, discomfort, or fever as directed by your caregiver.  Do not take aspirin, ibuprofen, or other nonsteroidal anti-inflammatory drugs (NSAIDs). SEEK IMMEDIATE MEDICAL CARE IF:   You have pain in your arms, neck, jaw, teeth, or back.  Your pain increases or changes in intensity or duration.  You develop nausea, vomiting, or sweating (diaphoresis).  You develop shortness of breath, or you faint.  Your vomit is green, yellow, black, or looks like coffee grounds or blood.  Your stool is red, bloody, or black. These symptoms could be signs of other problems, such as heart disease, gastric bleeding, or esophageal bleeding. MAKE SURE YOU:   Understand these instructions.  Will watch your condition.  Will get help right away if you are not doing well or get worse. Document Released: 09/16/2005 Document Revised: 02/29/2012 Document Reviewed: 06/26/2011 [facility] Patient Information 2013 [place], [place]. Chest Pain (Nonspecific) It is often hard to give a specific diagnosis for the cause of chest pain. There is always a chance that your pain could be related to something serious, such as a heart attack or a blood clot in the lungs. You need to follow up with your caregiver for further evaluation. CAUSES   Heartburn.  Pneumonia or bronchitis.  Anxiety or stress.  Inflammation around your heart (pericarditis) or lung (pleuritis or pleurisy).  A blood clot in the lung.  A collapsed lung (pneumothorax). It can develop suddenly on its own (spontaneous pneumothorax) or from injury (trauma) to the chest.  Shingles infection (herpes zoster virus). The chest wall is composed of bones, muscles, and cartilage. Any of these can be the source of the pain.  The bones can be bruised by injury.  The muscles or  cartilage can be strained by coughing or overwork.  The cartilage can be affected by inflammation and become sore (costochondritis). DIAGNOSIS  Lab tests or other studies, such as X-rays, electrocardiography, stress testing, or cardiac imaging, may be needed to find the cause of your pain.  TREATMENT   Treatment depends on what may be causing your chest pain. Treatment may include:  Acid blockers for heartburn.  Anti-inflammatory medicine.  Pain medicine for inflammatory conditions.  Antibiotics if an infection is present.  You may be advised to change lifestyle habits. This includes stopping smoking and avoiding alcohol, caffeine, and chocolate.  You may be advised to keep your head raised (elevated) when sleeping. This reduces the chance of acid going backward from your stomach into your esophagus.  Most of the time, nonspecific chest pain will improve within 2 to 3 days with rest and mild pain medicine. HOME CARE INSTRUCTIONS   If antibiotics were prescribed, take your antibiotics as directed. Finish them even if you start to feel better.  For the next few days, avoid physical activities that bring on chest pain. Continue physical activities as directed.  Do not smoke.  Avoid drinking alcohol.  Only take over-the-counter or prescription medicine for pain, discomfort, or fever as directed by your caregiver.  Follow your caregiver's suggestions for further testing if your chest pain does not go away.  Keep any follow-up appointments you made. If you do not go to an appointment,  you could develop lasting (chronic) problems with pain. If there is any problem keeping an appointment, you must call to reschedule. SEEK MEDICAL CARE IF:   You think you are having problems from the medicine you are taking. Read your medicine instructions carefully.  Your chest pain does not go away, even after treatment.  You develop a rash with blisters on your chest. SEEK IMMEDIATE MEDICAL CARE  IF:   You have increased chest pain or pain that spreads to your arm, neck, jaw, back, or abdomen.  You develop shortness of breath, an increasing cough, or you are coughing up blood.  You have severe back or abdominal pain, feel nauseous, or vomit.  You develop severe weakness, fainting, or chills.  You have a fever. THIS IS AN EMERGENCY. Do not wait to see if the pain will go away. Get medical help at once. Call your local emergency services (911 in [place] Do not drive yourself to the hospital. MAKE SURE YOU:   Understand these instructions.  Will watch your condition.  Will get help right away if you are not doing well or get worse. Document Released: 09/16/2005 Document Revised: 02/29/2012 Document Reviewed: 07/12/2008 [facility] Patient Information 2013 [place], [place].

## 2012-12-07 ENCOUNTER — Telehealth: Payer: Self-pay

## 2012-12-07 ENCOUNTER — Telehealth: Payer: Self-pay | Admitting: Physician Assistant

## 2012-12-07 ENCOUNTER — Emergency Department (HOSPITAL_BASED_OUTPATIENT_CLINIC_OR_DEPARTMENT_OTHER): Payer: BC Managed Care – PPO

## 2012-12-07 ENCOUNTER — Encounter (HOSPITAL_BASED_OUTPATIENT_CLINIC_OR_DEPARTMENT_OTHER): Payer: Self-pay

## 2012-12-07 ENCOUNTER — Emergency Department (HOSPITAL_BASED_OUTPATIENT_CLINIC_OR_DEPARTMENT_OTHER)
Admission: EM | Admit: 2012-12-07 | Discharge: 2012-12-07 | Disposition: A | Discharge status: Home / Self Care | Payer: BC Managed Care – PPO | Attending: Emergency Medicine | Admitting: Emergency Medicine

## 2012-12-07 ENCOUNTER — Other Ambulatory Visit (HOSPITAL_BASED_OUTPATIENT_CLINIC_OR_DEPARTMENT_OTHER): Payer: BC Managed Care – PPO

## 2012-12-07 DIAGNOSIS — R0789 Other chest pain: Secondary | ICD-10-CM | POA: Insufficient documentation

## 2012-12-07 DIAGNOSIS — R079 Chest pain, unspecified: Secondary | ICD-10-CM

## 2012-12-07 DIAGNOSIS — R7989 Other specified abnormal findings of blood chemistry: Secondary | ICD-10-CM

## 2012-12-07 DIAGNOSIS — R791 Abnormal coagulation profile: Secondary | ICD-10-CM | POA: Insufficient documentation

## 2012-12-07 DIAGNOSIS — Z79899 Other long term (current) drug therapy: Secondary | ICD-10-CM | POA: Insufficient documentation

## 2012-12-07 DIAGNOSIS — Z792 Long term (current) use of antibiotics: Secondary | ICD-10-CM | POA: Insufficient documentation

## 2012-12-07 DIAGNOSIS — Z8739 Personal history of other diseases of the musculoskeletal system and connective tissue: Secondary | ICD-10-CM | POA: Insufficient documentation

## 2012-12-07 DIAGNOSIS — Z9889 Other specified postprocedural states: Secondary | ICD-10-CM | POA: Insufficient documentation

## 2012-12-07 DIAGNOSIS — R05 Cough: Secondary | ICD-10-CM | POA: Insufficient documentation

## 2012-12-07 DIAGNOSIS — R059 Cough, unspecified: Secondary | ICD-10-CM | POA: Insufficient documentation

## 2012-12-07 HISTORY — DX: Unspecified osteoarthritis, unspecified site: M19.90

## 2012-12-07 LAB — TROPONIN I: Troponin I: <0.3 ng/mL (ref <0.30)

## 2012-12-07 LAB — BASIC METABOLIC PANEL
Calcium: 9.9 mg/dL (ref 8.4–10.5)
GFR calc Af Amer: 49 mL/min — ABNORMAL LOW (ref >90)
GFR calc non Af Amer: 43 mL/min — ABNORMAL LOW (ref >90)
Glucose, Bld: 102 mg/dL — ABNORMAL HIGH (ref 70–99)
Potassium: 4.1 mEq/L (ref 3.5–5.1)
Sodium: 141 mEq/L (ref 135–145)

## 2012-12-07 LAB — CBC WITH DIFFERENTIAL/PLATELET
Basophils Relative: 1 % (ref 0–1)
Eosinophils Absolute: 0.4 10*3/uL (ref 0.0–0.7)
Eosinophils Relative: 7 % — ABNORMAL HIGH (ref 0–5)
Lymphs Abs: 2 10*3/uL (ref 0.7–4.0)
MCH: 31.8 pg (ref 26.0–34.0)
MCHC: 34.3 g/dL (ref 30.0–36.0)
MCV: 92.5 fL (ref 78.0–100.0)
Neutrophils Relative %: 37 % — ABNORMAL LOW (ref 43–77)
Platelets: 248 10*3/uL (ref 150–400)
RDW: 12.6 % (ref 11.5–15.5)

## 2012-12-07 MED ORDER — SODIUM CHLORIDE 0.9 % IV BOLUS (SEPSIS)
1000.0000 mL | Freq: Once | INTRAVENOUS | Status: AC
  Administered 2012-12-07: 1000 mL via INTRAVENOUS

## 2012-12-07 MED ORDER — DIPHENHYDRAMINE HCL 50 MG/ML IJ SOLN
25.0000 mg | Freq: Once | INTRAMUSCULAR | Status: AC
  Administered 2012-12-07: 25 mg via INTRAVENOUS
  Filled 2012-12-07: qty 1

## 2012-12-07 MED ORDER — IOHEXOL 350 MG/ML SOLN: 80.0000 mL | Freq: Once | INTRAVENOUS | Status: DC | PRN

## 2012-12-07 MED ORDER — METHYLPREDNISOLONE SODIUM SUCC 125 MG IJ SOLR
125.0000 mg | Freq: Once | INTRAMUSCULAR | Status: AC
  Administered 2012-12-07: 125 mg via INTRAVENOUS
  Filled 2012-12-07: qty 2

## 2012-12-07 MED ORDER — SODIUM CHLORIDE 0.9 % IV SOLN
Freq: Once | INTRAVENOUS | Status: AC
  Administered 2012-12-07: 19:00:00 via INTRAVENOUS

## 2012-12-07 NOTE — Telephone Encounter (Signed)
Prior Auth for CT angio chest with and without contrast. CPT 71275  Auth number is [id number]

## 2012-12-07 NOTE — Telephone Encounter (Signed)
[facility] called with an Alert Lab for patient's D-Dimer at 3.98. This has not been addressed, please result to me.

## 2012-12-07 NOTE — ED Notes (Signed)
Pt has had SHOB and chest pain x 2 weeks, was seen by PCP and called today with an elevated d dimer.  Pt is here for  Diagnostics.

## 2012-12-07 NOTE — Telephone Encounter (Signed)
Left message on with results on pt's vm

## 2012-12-07 NOTE — Telephone Encounter (Signed)
Prior Auth for CT chest approved. # [id number]   Cancelled test- see next note.

## 2012-12-07 NOTE — ED Notes (Signed)
Pt returned from CT. Pt shows no sign of reaction to IV contrast. No signs of rash at IV site. Pt denies any difficulty breathing or difficulty swallowing.

## 2012-12-07 NOTE — ED Provider Notes (Signed)
History     CSN: [id number]  Arrival date & time 12/07/12  1728   First MD Initiated Contact with Patient 12/07/12 1806      Chief Complaint  Patient presents with  . Shortness of Breath  . Chest Pain    (Consider location/radiation/quality/duration/timing/severity/associated sxs/prior treatment) HPI  Patient has had some shortness of breath and anterior chest pressure for 2 weeks. She has had some URI symptoms and coughing. She has a history of rheumatoid arthritis. She was seen by her primary care physician yesterday and had a d-dimer at 3. Dr. Glade Lloyd called her today and asked her to have a CT angiogram. She told Dr. 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 She was sent here to have allergic reaction protocol done and have a CT angiogram. I spoke with Dr. Glade Lloyd prior to patient coming here. I've seen and evaluated the patient here and discussed the above history with her. She has had cough which has been nonproductive. She has been taking by mouth without difficulty. The pain has been present constantly for 2 weeks although it is waxing and waning in nature. It worsens with a deep breath and with coughing. She has not had any leg swelling, hormone use, or recent long trips or history of DVT or PE.  Past Medical History  Diagnosis Date  . Arthritis     Past Surgical History  Procedure Date  . Bladder surgery   . Kidney surgery   . Kidney surgery   . Kidney surgery     No family history on file.  History  Substance Use Topics  . Smoking status: Never Smoker   . Smokeless tobacco: Not on file  . Alcohol Use: Yes     Comment: wine every other day    OB History    Grav Para Term Preterm Abortions TAB SAB Ect Mult Living                  Review of Systems  Constitutional: Negative for fever, chills, activity change, appetite change and unexpected weight change.  HENT: Negative for sore throat, rhinorrhea, neck pain, neck stiffness and sinus  pressure.   Eyes: Negative for visual disturbance.  Respiratory: Negative for cough and shortness of breath.   Cardiovascular: Negative for chest pain and leg swelling.  Gastrointestinal: Negative for vomiting, abdominal pain, diarrhea and blood in stool.  Genitourinary: Negative for dysuria, urgency, frequency, vaginal discharge and difficulty urinating.  Musculoskeletal: Negative for myalgias, arthralgias and gait problem.  Skin: Negative for color change and rash.  Neurological: Negative for weakness, light-headedness and headaches.  Hematological: Does not bruise/bleed easily.  Psychiatric/Behavioral: Negative for dysphoric mood.    Allergies  Ambien; Levofloxacin; Trazodone and nefazodone; and Contrast media  Home Medications   Current Outpatient Rx  Name  Route  Sig  Dispense  Refill  . AMOXICILLIN-POT CLAVULANATE 875-125 MG PO TABS   Oral   Take 1 tablet by mouth 2 (two) times daily.   20 tablet   0   . DEXTROAMPHETAMINE SULFATE 10 MG PO TABS   Oral   Take 1 tablet (10 mg total) by mouth 2 (two) times daily.   60 tablet   0   . FLUOXETINE HCL 20 MG PO CAPS   Oral   Take 1 capsule (20 mg total) by mouth daily.   30 capsule   3   . HYDROXYCHLOROQUINE SULFATE 200 MG PO TABS  Take 1 1/2 tabs po qd          . MELOXICAM 15 MG PO TABS   Oral   Take 1 tablet (15 mg total) by mouth daily.   30 tablet   2   . SULFASALAZINE 500 MG PO TABS   Oral   Take 500 mg by mouth 2 (two) times daily.             BP 132/82  Pulse 80  Temp 98.2 F (36.8 C) (Oral)  Resp 16  Ht 5\' 1"  (1.549 m)  Wt 148 lb (67.132 kg)  BMI 27.96 kg/m2  SpO2 93%  Physical Exam  Nursing note and vitals reviewed. Constitutional: She appears well-developed and well-nourished.  HENT:  Head: Normocephalic and atraumatic.  Eyes: Conjunctivae normal and EOM are normal. Pupils are equal, round, and reactive to light.  Neck: Normal range of motion. Neck supple.  Cardiovascular: Normal  rate, regular rhythm, normal heart sounds and intact distal pulses.   Pulmonary/Chest: Effort normal and breath sounds normal.  Abdominal: Soft. Bowel sounds are normal.  Musculoskeletal: Normal range of motion.  Neurological: She is alert.  Skin: Skin is warm and dry.  Psychiatric: She has a normal mood and affect. Thought content normal.    ED Course  Procedures (including critical care time)  Labs Reviewed  CBC WITH DIFFERENTIAL - Abnormal; Notable for the following:    Neutrophils Relative 37 (*)     Monocytes Relative 15 (*)     Eosinophils Relative 7 (*)     All other components within normal limits  BASIC METABOLIC PANEL - Abnormal; Notable for the following:    Glucose, Bld 102 (*)     Creatinine, Ser 1.40 (*)     GFR calc non Af Amer 43 (*)     GFR calc Af Amer 49 (*)     All other components within normal limits  D-DIMER, QUANTITATIVE - Abnormal; Notable for the following:    D-Dimer, Quant 2.47 (*)     All other components within normal limits  TROPONIN I   Dg Chest 2 View  12/06/2012  *RADIOLOGY REPORT*  Clinical Data: Tightness in the chest and left-sided chest pain for 3 weeks.  CHEST - 2 VIEW  Comparison: 10/13/2010.  Findings: The lungs are clear and fully expanded.  No effusions or pneumothoraces.  The heart, mediastinum and hilar contours are normal.  There are no bony abnormalities.  IMPRESSION: Normal chest.   Original Report Authenticated By: Sander Radon, M.D.    Ct Angio Chest Pe W/cm &/or Wo Cm  12/07/2012  *RADIOLOGY REPORT*  Clinical Data: Shortness of breath, chest pain, elevated D-dimer  CT ANGIOGRAPHY CHEST  Technique:  Multidetector CT imaging of the chest using the standard protocol during bolus administration of intravenous contrast. Multiplanar reconstructed images including MIPs were obtained and reviewed to evaluate the vascular anatomy.  Contrast:  80 ml Omnipaque 350 IV  Comparison: Chest radiographs dated 12/06/2012  Findings: No evidence  of pulmonary embolism.  Lungs are essentially clear.  No suspicious pulmonary nodules.  No focal consolidation.  Mild dependent atelectasis in the bilateral lower lobes. No pleural effusion or pneumothorax.  Visualized thyroid is unremarkable.  The heart is normal in size.  No pericardial effusion.  No suspicious mediastinal, hilar, or axillary lymphadenopathy.  Visualized upper abdomen is notable for severe hepatic steatosis and a probable 1.9 cm cyst in the central liver (series 4/image 68).  Mild degenerative changes of the visualized  thoracolumbar spine.  IMPRESSION: No evidence of pulmonary embolism.  No evidence of acute cardiopulmonary disease.  Severe hepatic steatosis.   Original Report Authenticated By: Charline Bills, M.D.      No diagnosis found.   Results for orders placed during the hospital encounter of 12/07/12  CBC WITH DIFFERENTIAL      Component Value Range   WBC 5.3  4.0 - 10.5 K/uL   RBC 4.28  3.87 - 5.11 MIL/uL   Hemoglobin 13.6  12.0 - 15.0 g/dL   HCT 40.9  81.1 - 91.4 %   MCV 92.5  78.0 - 100.0 fL   MCH 31.8  26.0 - 34.0 pg   MCHC 34.3  30.0 - 36.0 g/dL   RDW 78.2  95.6 - 21.3 %   Platelets 248  150 - 400 K/uL   Neutrophils Relative 37 (*) 43 - 77 %   Neutro Abs 2.0  1.7 - 7.7 K/uL   Lymphocytes Relative 39  12 - 46 %   Lymphs Abs 2.0  0.7 - 4.0 K/uL   Monocytes Relative 15 (*) 3 - 12 %   Monocytes Absolute 0.8  0.1 - 1.0 K/uL   Eosinophils Relative 7 (*) 0 - 5 %   Eosinophils Absolute 0.4  0.0 - 0.7 K/uL   Basophils Relative 1  0 - 1 %   Basophils Absolute 0.1  0.0 - 0.1 K/uL  BASIC METABOLIC PANEL      Component Value Range   Sodium 141  135 - 145 mEq/L   Potassium 4.1  3.5 - 5.1 mEq/L   Chloride 106  96 - 112 mEq/L   CO2 25  19 - 32 mEq/L   Glucose, Bld 102 (*) 70 - 99 mg/dL   BUN 13  6 - 23 mg/dL   Creatinine, Ser 0.86 (*) 0.50 - 1.10 mg/dL   Calcium 9.9  8.4 - 57.8 mg/dL   GFR calc non Af Amer 43 (*) >90 mL/min   GFR calc Af Amer 49 (*) >90  mL/min  D-DIMER, QUANTITATIVE      Component Value Range   D-Dimer, Quant 2.47 (*) 0.00 - 0.48 ug/mL-FEU  TROPONIN I      Component Value Range   Troponin I <0.30  <0.30 ng/mL    Dg Chest 2 View  12/06/2012  *RADIOLOGY REPORT*  Clinical Data: Tightness in the chest and left-sided chest pain for 3 weeks.  CHEST - 2 VIEW  Comparison: 10/13/2010.  Findings: The lungs are clear and fully expanded.  No effusions or pneumothoraces.  The heart, mediastinum and hilar contours are normal.  There are no bony abnormalities.  IMPRESSION: Normal chest.   Original Report Authenticated By: Sander Radon, M.D.    Ct Angio Chest Pe W/cm &/or Wo Cm  12/07/2012  *RADIOLOGY REPORT*  Clinical Data: Shortness of breath, chest pain, elevated D-dimer  CT ANGIOGRAPHY CHEST  Technique:  Multidetector CT imaging of the chest using the standard protocol during bolus administration of intravenous contrast. Multiplanar reconstructed images including MIPs were obtained and reviewed to evaluate the vascular anatomy.  Contrast:  80 ml Omnipaque 350 IV  Comparison: Chest radiographs dated 12/06/2012  Findings: No evidence of pulmonary embolism.  Lungs are essentially clear.  No suspicious pulmonary nodules.  No focal consolidation.  Mild dependent atelectasis in the bilateral lower lobes. No pleural effusion or pneumothorax.  Visualized thyroid is unremarkable.  The heart is normal in size.  No pericardial effusion.  No suspicious mediastinal, hilar, or  axillary lymphadenopathy.  Visualized upper abdomen is notable for severe hepatic steatosis and a probable 1.9 cm cyst in the central liver (series 4/image 68).  Mild degenerative changes of the visualized thoracolumbar spine.  IMPRESSION: No evidence of pulmonary embolism.  No evidence of acute cardiopulmonary disease.  Severe hepatic steatosis.   Original Report Authenticated By: Charline Bills, M.D.    Date: 12/07/2012  Rate: 84  Rhythm: normal sinus rhythm  QRS Axis:  normal  Intervals: normal  ST/T Wave abnormalities: normal  Conduction Disutrbances: none  Narrative Interpretation: unremarkable     MDM  Patient with negative CT angiogram. Patient with creatinine at 1.4. She is given pretreatment prior to CT angiogram and had no symptoms with the IV contrast. She is receiving 2 L of normal saline. She is advised to have her creatinine rechecked tomorrow with her primary care doctor and to drink plenty of fluids. I do not see any source of the chest pain with normal EKG and normal CT angiogram and normal troponin. She did again have an elevated d-dimer here. She denies any signs of peripheral clot. She does have a history of rheumatoid arthritis. Suspect that she has had some upper respiratory infection symptoms with bronchitis pressure and chest wall pain. She's advised return or she is worse at any time.       Hilario Quarry, MD 12/08/12 [phone]

## 2012-12-07 NOTE — Telephone Encounter (Signed)
Let pt know that CMP was normal except liver enzymes in which she has hx of elevation. They are better than have been in the past.

## 2012-12-07 NOTE — Telephone Encounter (Signed)
Order placed. Let call to make sure thay can get her in today.

## 2012-12-07 NOTE — Telephone Encounter (Signed)
Call pt: they can pre medicate her to avoid reaction to the contrast. .  Appt is 6:30 for scan.  She needs to be at the ED [facility]) at 5:00. Tell her this is urgent.

## 2012-12-07 NOTE — ED Notes (Signed)
MD at bedside. 

## 2012-12-07 NOTE — Telephone Encounter (Signed)
Dr Thurmond Butts called and states the patient has a history of RA and this may make the D-Dimer elevated. He did recommend a CT Chest.

## 2012-12-08 ENCOUNTER — Encounter: Payer: Self-pay | Admitting: Family Medicine

## 2012-12-08 ENCOUNTER — Telehealth: Payer: Self-pay | Admitting: Family Medicine

## 2012-12-08 ENCOUNTER — Encounter: Payer: Self-pay | Admitting: *Deleted

## 2012-12-08 DIAGNOSIS — K760 Fatty (change of) liver, not elsewhere classified: Secondary | ICD-10-CM | POA: Insufficient documentation

## 2012-12-08 NOTE — Telephone Encounter (Signed)
CT results given and discussed. Jocelyn Kline already sees [facility], she reports having a recent endo and colonoscopy which were both normal. I advised her to f/u with GI regarding her recent lab results and CT scan. I will fax CT result and labs. She has already had Hep A and B series, which I added to her record.

## 2012-12-08 NOTE — Telephone Encounter (Signed)
Please call pt and let her know Ct was neg for blood clot but did show severe fatty liver. Tx is low fat diet and regular exercise and weight loss. And I would like for her to see GI.  She also needs to be immunized against Hepatitis A/B.  We can start that series here. Let me know if ok with GI referrral.

## 2012-12-10 ENCOUNTER — Other Ambulatory Visit: Payer: Self-pay | Admitting: Family Medicine

## 2012-12-21 HISTORY — PX: BUNIONECTOMY: SHX129

## 2013-05-08 ENCOUNTER — Other Ambulatory Visit: Payer: Self-pay | Admitting: Family Medicine

## 2013-08-14 IMAGING — CT CT ANGIO CHEST
2 of 6 series · 19 of 36 positions shown · IV contrast (APPLIED)
Comparison: Chest radiographs dated 12/06/2012

CLINICAL DATA: Shortness of breath, chest pain, elevated D-dimer

CT ANGIOGRAPHY CHEST
TECHNIQUE: Multidetector CT imaging of the chest using the
standard protocol during bolus administration of intravenous
contrast. Multiplanar reconstructed images including MIPs were
obtained and reviewed to evaluate the vascular anatomy.
Contrast:  80 ml Omnipaque 350 IV

[Series 5: pe 1.0 b26f · axial · 0.59mm/px · z∈[+1045,+1239]mm · 18 of 216 slices shown]
[im 11/216  lung]
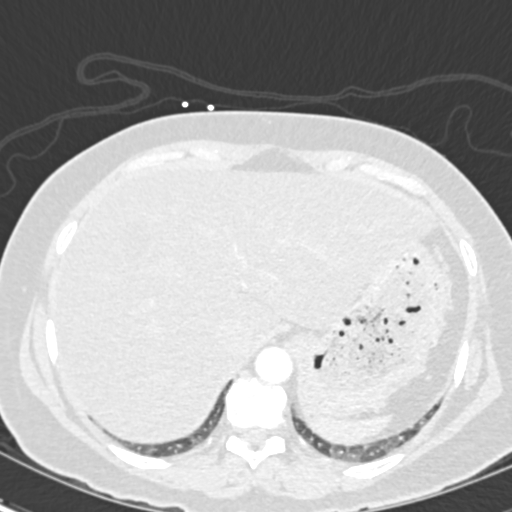
[im 22/216  mediastinal]
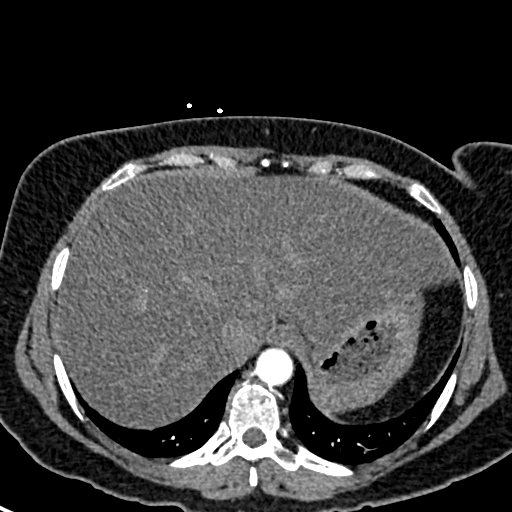
[im 33/216  lung]
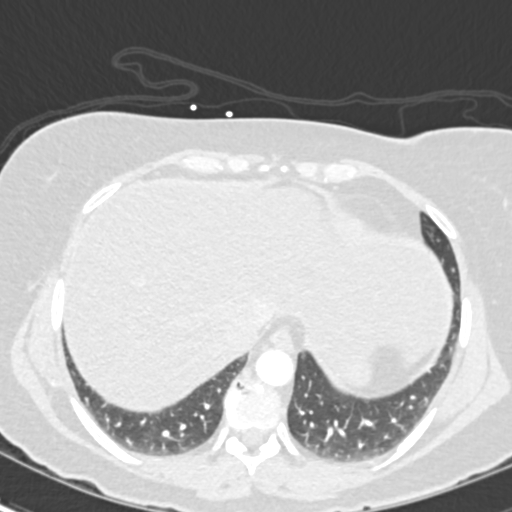
[im 44/216  mediastinal]
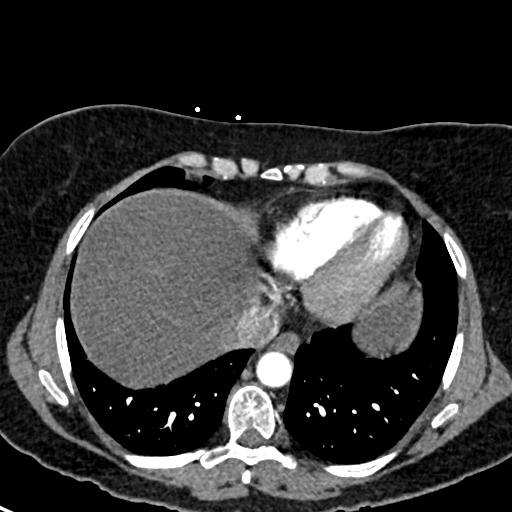
[im 54/216  lung]
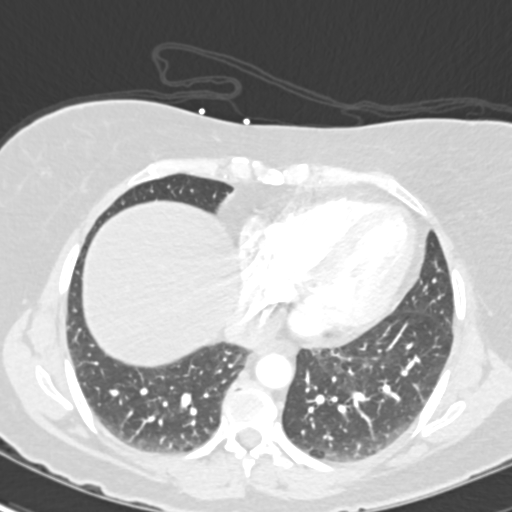
[im 65/216  mediastinal]
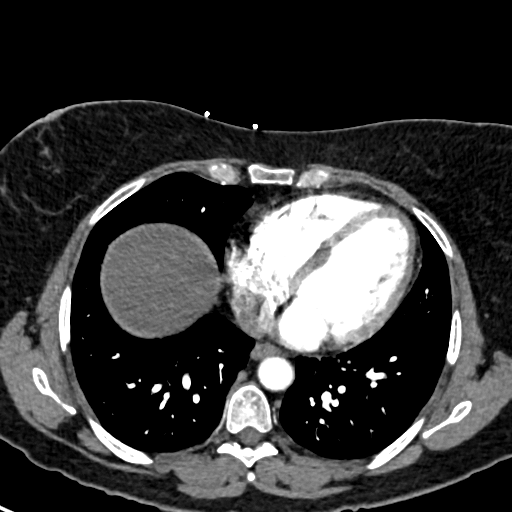
[im 76/216  lung]
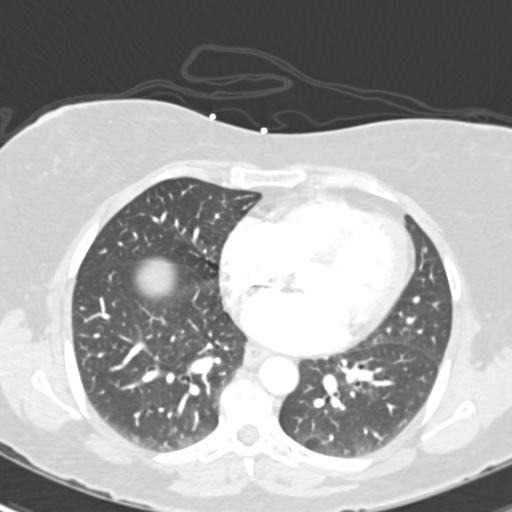
[im 87/216  mediastinal]
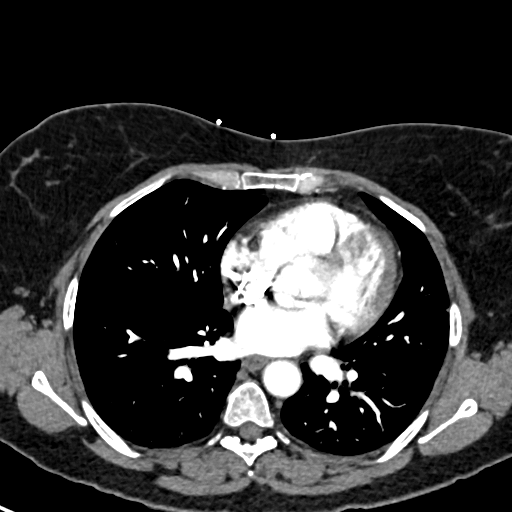
[im 97/216  lung]
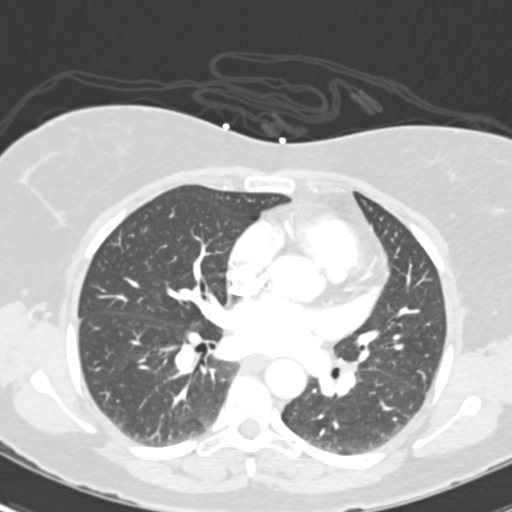
[im 119/216  mediastinal]
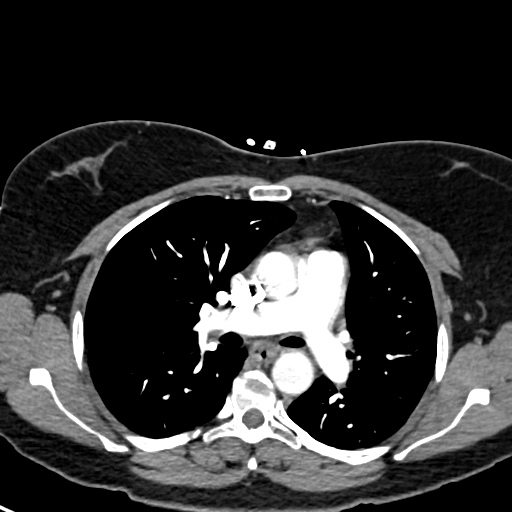
[im 130/216  lung]
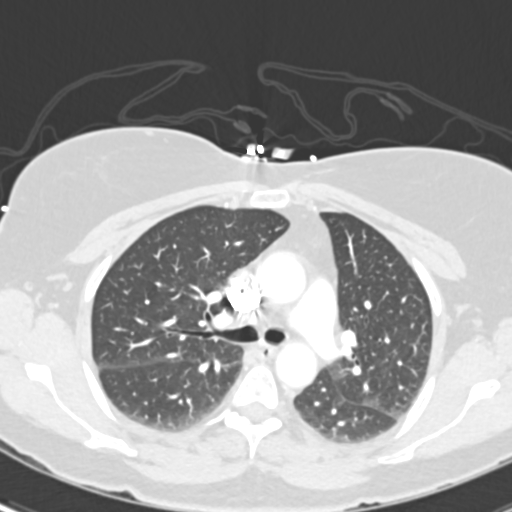
[im 140/216  mediastinal]
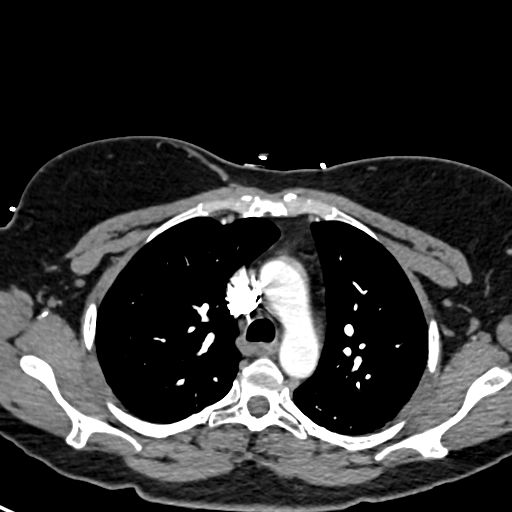
[im 151/216  lung]
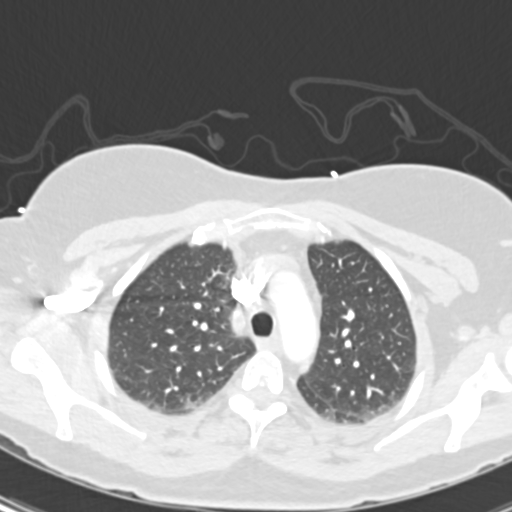
[im 162/216  mediastinal]
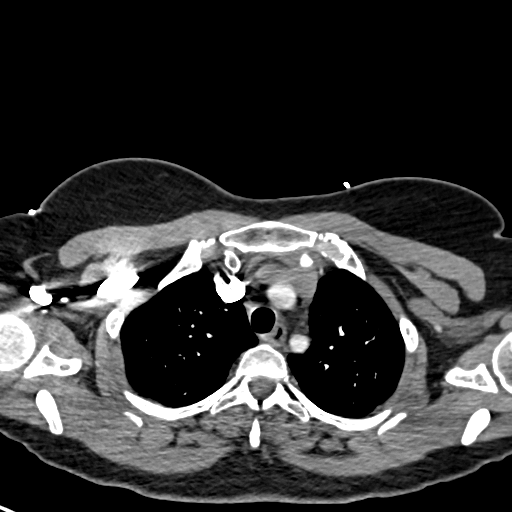
[im 173/216  lung]
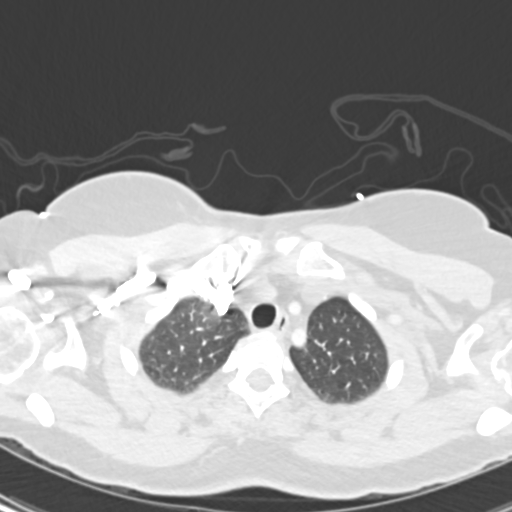
[im 183/216  mediastinal]
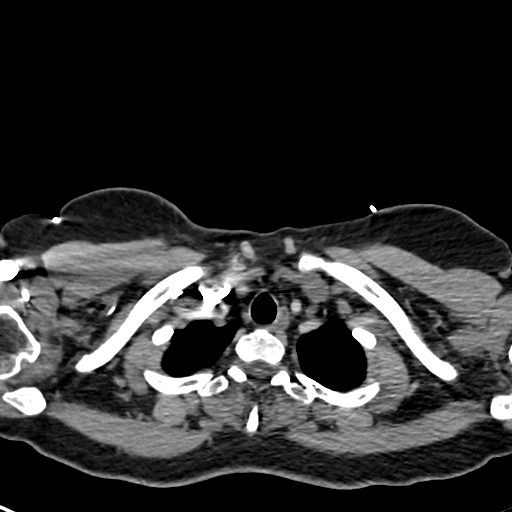
[im 194/216  lung]
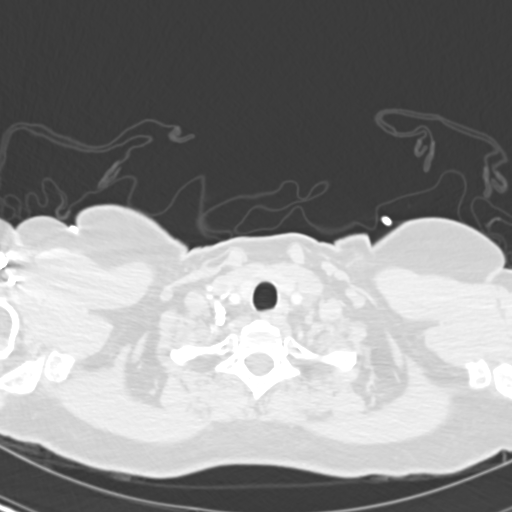
[im 205/216  mediastinal]
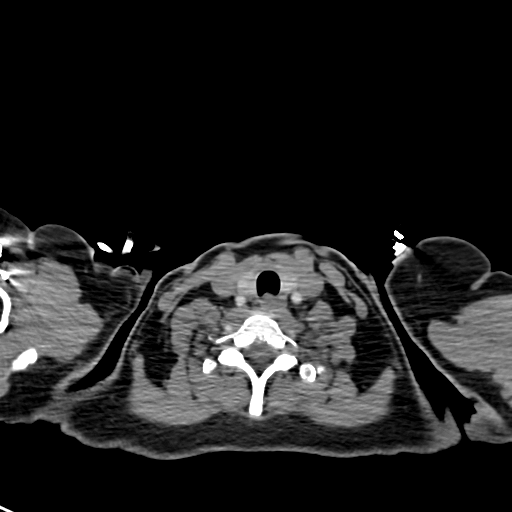

[Series 8: pe 2.0 coronal · coronal · 0.45mm/px · 1 of 105 slices shown]
[im 53/105  mediastinal]
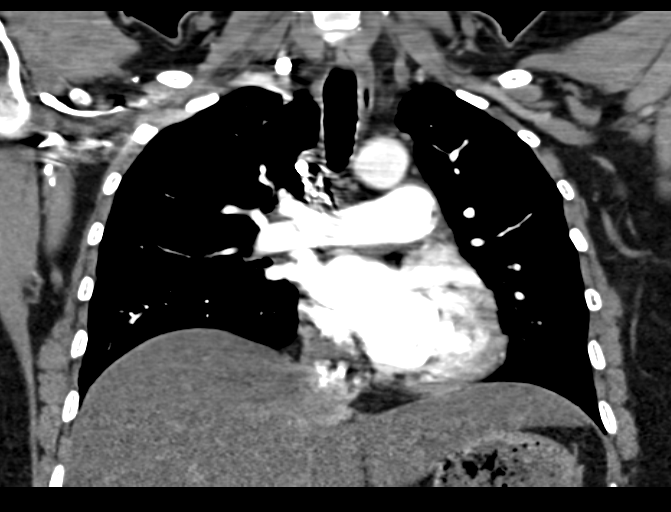

[19 of 36 positions shown; findings below may reference images not displayed]

FINDINGS: No evidence of pulmonary embolism.

Lungs are essentially clear.  No suspicious pulmonary nodules.  No
focal consolidation.  Mild dependent atelectasis in the bilateral
lower lobes. No pleural effusion or pneumothorax.

Visualized thyroid is unremarkable.

The heart is normal in size.  No pericardial effusion.

No suspicious mediastinal, hilar, or axillary lymphadenopathy.

Visualized upper abdomen is notable for severe hepatic steatosis
and a probable 1.9 cm cyst in the central liver (series 4/image
68).

Mild degenerative changes of the visualized thoracolumbar spine.
IMPRESSION: No evidence of pulmonary embolism.

No evidence of acute cardiopulmonary disease.

Severe hepatic steatosis.

## 2013-09-22 ENCOUNTER — Other Ambulatory Visit: Payer: Self-pay | Admitting: Family Medicine

## 2013-11-21 ENCOUNTER — Other Ambulatory Visit: Payer: Self-pay | Admitting: Family Medicine

## 2019-06-20 MED FILL — DIAZEPAM 10 MG TABS: 10 | 1 days supply | Qty: 2 | Fill #0

## 2019-06-21 ENCOUNTER — Other Ambulatory Visit: Payer: Self-pay | Admitting: Urology

## 2019-06-27 ENCOUNTER — Other Ambulatory Visit (HOSPITAL_COMMUNITY)
Admission: RE | Admit: 2019-06-27 | Discharge: 2019-06-27 | Disposition: A | Discharge status: Home / Self Care | Payer: BLUE CROSS/BLUE SHIELD | Source: Ambulatory Visit | Attending: Urology | Admitting: Urology

## 2019-06-27 DIAGNOSIS — Z01812 Encounter for preprocedural laboratory examination: Secondary | ICD-10-CM | POA: Diagnosis not present

## 2019-06-27 DIAGNOSIS — Z1159 Encounter for screening for other viral diseases: Secondary | ICD-10-CM | POA: Diagnosis not present

## 2019-06-27 LAB — SARS CORONAVIRUS 2 (TAT 6-24 HRS): SARS Coronavirus 2: NEGATIVE

## 2019-06-29 ENCOUNTER — Other Ambulatory Visit: Payer: Self-pay

## 2019-06-29 ENCOUNTER — Encounter (HOSPITAL_BASED_OUTPATIENT_CLINIC_OR_DEPARTMENT_OTHER): Payer: Self-pay | Admitting: *Deleted

## 2019-06-29 NOTE — Progress Notes (Signed)

## 2019-06-29 NOTE — Progress Notes (Signed)
Spoke w/ pt via phone for pre-op interview.  Npo after mn.  Arrive at 0700.  Pt had covid test done yesterday.

## 2019-06-30 ENCOUNTER — Ambulatory Visit (HOSPITAL_BASED_OUTPATIENT_CLINIC_OR_DEPARTMENT_OTHER): Payer: BLUE CROSS/BLUE SHIELD | Admitting: Anesthesiology

## 2019-06-30 ENCOUNTER — Encounter (HOSPITAL_BASED_OUTPATIENT_CLINIC_OR_DEPARTMENT_OTHER): Payer: Self-pay | Admitting: *Deleted

## 2019-06-30 ENCOUNTER — Ambulatory Visit (HOSPITAL_BASED_OUTPATIENT_CLINIC_OR_DEPARTMENT_OTHER)
Admission: RE | Admit: 2019-06-30 | Discharge: 2019-06-30 | Disposition: A | Discharge status: Home / Self Care | Payer: BLUE CROSS/BLUE SHIELD | Attending: Urology | Admitting: Urology

## 2019-06-30 ENCOUNTER — Other Ambulatory Visit: Payer: Self-pay

## 2019-06-30 ENCOUNTER — Encounter (HOSPITAL_BASED_OUTPATIENT_CLINIC_OR_DEPARTMENT_OTHER)
Admission: RE | Disposition: A | Discharge status: Home / Self Care | Payer: Self-pay | Source: Home / Self Care | Attending: Urology

## 2019-06-30 DIAGNOSIS — Z881 Allergy status to other antibiotic agents status: Secondary | ICD-10-CM | POA: Diagnosis not present

## 2019-06-30 DIAGNOSIS — Z888 Allergy status to other drugs, medicaments and biological substances status: Secondary | ICD-10-CM | POA: Insufficient documentation

## 2019-06-30 DIAGNOSIS — M069 Rheumatoid arthritis, unspecified: Secondary | ICD-10-CM | POA: Diagnosis not present

## 2019-06-30 DIAGNOSIS — Z79891 Long term (current) use of opiate analgesic: Secondary | ICD-10-CM | POA: Diagnosis not present

## 2019-06-30 DIAGNOSIS — C670 Malignant neoplasm of trigone of bladder: Secondary | ICD-10-CM | POA: Insufficient documentation

## 2019-06-30 DIAGNOSIS — Z79899 Other long term (current) drug therapy: Secondary | ICD-10-CM | POA: Diagnosis not present

## 2019-06-30 DIAGNOSIS — K219 Gastro-esophageal reflux disease without esophagitis: Secondary | ICD-10-CM | POA: Diagnosis not present

## 2019-06-30 DIAGNOSIS — D494 Neoplasm of unspecified behavior of bladder: Secondary | ICD-10-CM | POA: Diagnosis present

## 2019-06-30 HISTORY — DX: Duplication of ureter: Q62.5

## 2019-06-30 HISTORY — DX: Gross hematuria: R31.0

## 2019-06-30 HISTORY — DX: Depression, unspecified: F32.A

## 2019-06-30 HISTORY — DX: Personal history of urinary (tract) infections: Z87.440

## 2019-06-30 HISTORY — DX: Attention-deficit hyperactivity disorder, unspecified type: F90.9

## 2019-06-30 HISTORY — PX: CYSTOSCOPY W/ RETROGRADES: SHX1426

## 2019-06-30 HISTORY — DX: Urgency of urination: R39.15

## 2019-06-30 HISTORY — DX: Personal history of other diseases of urinary system: Z87.448

## 2019-06-30 HISTORY — DX: Rheumatoid arthritis, unspecified: M06.9

## 2019-06-30 HISTORY — PX: CYSTOSCOPY WITH BIOPSY: SHX5122

## 2019-06-30 SURGERY — CYSTOSCOPY, WITH BIOPSY
Anesthesia: General

## 2019-06-30 MED ORDER — KETOROLAC TROMETHAMINE 30 MG/ML IJ SOLN
INTRAMUSCULAR | Status: DC | PRN
  Administered 2019-06-30: 30 mg via INTRAVENOUS

## 2019-06-30 MED ORDER — FENTANYL CITRATE (PF) 100 MCG/2ML IJ SOLN
25.0000 ug | INTRAMUSCULAR | Status: DC | PRN
  Administered 2019-06-30 (×3): 25 ug via INTRAVENOUS
  Filled 2019-06-30: qty 1

## 2019-06-30 MED ORDER — MIRABEGRON ER 50 MG PO TB24: 50.0000 mg | ORAL_TABLET | Freq: Every day | ORAL | 0 refills | Status: AC

## 2019-06-30 MED ORDER — OXYCODONE HCL 5 MG PO TABS: 5.0000 mg | ORAL_TABLET | Freq: Four times a day (QID) | ORAL | 0 refills | Status: AC | PRN

## 2019-06-30 MED ORDER — LIDOCAINE 2% (20 MG/ML) 5 ML SYRINGE
INTRAMUSCULAR | Status: AC
  Filled 2019-06-30: qty 5

## 2019-06-30 MED ORDER — IOHEXOL 300 MG/ML  SOLN
INTRAMUSCULAR | Status: DC | PRN
  Administered 2019-06-30: 30 mL via URETHRAL

## 2019-06-30 MED ORDER — OXYCODONE HCL 5 MG/5ML PO SOLN
5.0000 mg | Freq: Once | ORAL | Status: AC | PRN
  Filled 2019-06-30: qty 5

## 2019-06-30 MED ORDER — PHENAZOPYRIDINE HCL 100 MG PO TABS
ORAL_TABLET | ORAL | Status: AC
  Filled 2019-06-30: qty 2

## 2019-06-30 MED ORDER — MIDAZOLAM HCL 2 MG/2ML IJ SOLN
INTRAMUSCULAR | Status: DC | PRN
  Administered 2019-06-30: 1 mg via INTRAVENOUS

## 2019-06-30 MED ORDER — LACTATED RINGERS IV SOLN
INTRAVENOUS | Status: DC
  Administered 2019-06-30 (×3): via INTRAVENOUS
  Filled 2019-06-30: qty 1000

## 2019-06-30 MED ORDER — FENTANYL CITRATE (PF) 100 MCG/2ML IJ SOLN
INTRAMUSCULAR | Status: DC | PRN
  Administered 2019-06-30 (×2): 50 ug via INTRAVENOUS

## 2019-06-30 MED ORDER — CEFAZOLIN SODIUM-DEXTROSE 2-4 GM/100ML-% IV SOLN
INTRAVENOUS | Status: AC
  Filled 2019-06-30: qty 100

## 2019-06-30 MED ORDER — BELLADONNA ALKALOIDS-OPIUM 16.2-60 MG RE SUPP
RECTAL | Status: AC
  Filled 2019-06-30: qty 1

## 2019-06-30 MED ORDER — MIDAZOLAM HCL 2 MG/2ML IJ SOLN
INTRAMUSCULAR | Status: AC
  Filled 2019-06-30: qty 2

## 2019-06-30 MED ORDER — DEXAMETHASONE SODIUM PHOSPHATE 10 MG/ML IJ SOLN
INTRAMUSCULAR | Status: DC | PRN
  Administered 2019-06-30: 5 mg via INTRAVENOUS

## 2019-06-30 MED ORDER — STERILE WATER FOR IRRIGATION IR SOLN
Status: DC | PRN
  Administered 2019-06-30: 3000 mL

## 2019-06-30 MED ORDER — ACETAMINOPHEN 500 MG PO TABS: 1000.0000 mg | ORAL_TABLET | Freq: Four times a day (QID) | ORAL | 0 refills | Status: AC | PRN

## 2019-06-30 MED ORDER — OXYCODONE HCL 5 MG PO TABS
ORAL_TABLET | ORAL | Status: AC
  Filled 2019-06-30: qty 1

## 2019-06-30 MED ORDER — EPHEDRINE 5 MG/ML INJ
INTRAVENOUS | Status: AC
  Filled 2019-06-30: qty 10

## 2019-06-30 MED ORDER — KETOROLAC TROMETHAMINE 30 MG/ML IJ SOLN
INTRAMUSCULAR | Status: AC
  Filled 2019-06-30: qty 1

## 2019-06-30 MED ORDER — PHENAZOPYRIDINE HCL 200 MG PO TABS
200.0000 mg | ORAL_TABLET | Freq: Three times a day (TID) | ORAL | Status: DC
  Administered 2019-06-30: 200 mg via ORAL
  Filled 2019-06-30: qty 1

## 2019-06-30 MED ORDER — ONDANSETRON HCL 4 MG/2ML IJ SOLN
INTRAMUSCULAR | Status: AC
  Filled 2019-06-30: qty 2

## 2019-06-30 MED ORDER — DEXAMETHASONE SODIUM PHOSPHATE 10 MG/ML IJ SOLN
INTRAMUSCULAR | Status: AC
  Filled 2019-06-30: qty 1

## 2019-06-30 MED ORDER — PHENAZOPYRIDINE HCL 200 MG PO TABS: 200.0000 mg | ORAL_TABLET | Freq: Three times a day (TID) | ORAL | 0 refills | Status: DC | PRN

## 2019-06-30 MED ORDER — ACETAMINOPHEN 500 MG PO TABS
1000.0000 mg | ORAL_TABLET | Freq: Once | ORAL | Status: AC
  Administered 2019-06-30: 08:00:00 1000 mg via ORAL
  Filled 2019-06-30: qty 2

## 2019-06-30 MED ORDER — ONDANSETRON HCL 4 MG/2ML IJ SOLN
4.0000 mg | Freq: Once | INTRAMUSCULAR | Status: DC | PRN
  Filled 2019-06-30: qty 2

## 2019-06-30 MED ORDER — LIDOCAINE 2% (20 MG/ML) 5 ML SYRINGE
INTRAMUSCULAR | Status: DC | PRN
  Administered 2019-06-30: 40 mg via INTRAVENOUS

## 2019-06-30 MED ORDER — ACETAMINOPHEN 500 MG PO TABS
ORAL_TABLET | ORAL | Status: AC
  Filled 2019-06-30: qty 2

## 2019-06-30 MED ORDER — PROPOFOL 10 MG/ML IV BOLUS
INTRAVENOUS | Status: DC | PRN
  Administered 2019-06-30: 50 mg via INTRAVENOUS
  Administered 2019-06-30: 110 mg via INTRAVENOUS

## 2019-06-30 MED ORDER — SODIUM CHLORIDE 0.9 % IR SOLN
Status: DC | PRN
  Administered 2019-06-30: 6000 mL
  Administered 2019-06-30: 3000 mL
  Administered 2019-06-30: 6000 mL

## 2019-06-30 MED ORDER — BELLADONNA ALKALOIDS-OPIUM 16.2-60 MG RE SUPP
RECTAL | Status: DC | PRN
  Administered 2019-06-30: 1 via RECTAL

## 2019-06-30 MED ORDER — EPHEDRINE SULFATE 50 MG/ML IJ SOLN
INTRAMUSCULAR | Status: DC | PRN
  Administered 2019-06-30: 15 mg via INTRAVENOUS

## 2019-06-30 MED ORDER — PROPOFOL 10 MG/ML IV BOLUS
INTRAVENOUS | Status: AC
  Filled 2019-06-30: qty 40

## 2019-06-30 MED ORDER — CEFAZOLIN SODIUM-DEXTROSE 2-4 GM/100ML-% IV SOLN
2.0000 g | INTRAVENOUS | Status: AC
  Administered 2019-06-30: 09:00:00 2 g via INTRAVENOUS
  Filled 2019-06-30: qty 100

## 2019-06-30 MED ORDER — OXYCODONE HCL 5 MG PO TABS
5.0000 mg | ORAL_TABLET | Freq: Once | ORAL | Status: AC | PRN
  Administered 2019-06-30: 5 mg via ORAL
  Filled 2019-06-30: qty 1

## 2019-06-30 MED ORDER — FENTANYL CITRATE (PF) 100 MCG/2ML IJ SOLN
INTRAMUSCULAR | Status: AC
  Filled 2019-06-30: qty 2

## 2019-06-30 MED ORDER — ONDANSETRON HCL 4 MG/2ML IJ SOLN
INTRAMUSCULAR | Status: DC | PRN
  Administered 2019-06-30: 4 mg via INTRAVENOUS

## 2019-06-30 SURGICAL SUPPLY — 30 items
BAG DRAIN URO-CYSTO SKYTR STRL (DRAIN) ×4 IMPLANT
BAG DRN UROCATH (DRAIN) ×2
BASKET LASER NITINOL 1.9FR (BASKET) IMPLANT
BSKT STON RTRVL 120 1.9FR (BASKET)
CATH URET 5FR 28IN OPEN ENDED (CATHETERS) ×4 IMPLANT
CATH URET DUAL LUMEN 6-10FR 50 (CATHETERS) IMPLANT
CLOTH BEACON ORANGE TIMEOUT ST (SAFETY) ×4 IMPLANT
ELECT REM PT RETURN 9FT ADLT (ELECTROSURGICAL) ×4
ELECTRODE REM PT RTRN 9FT ADLT (ELECTROSURGICAL) ×2 IMPLANT
EXTRACTOR STONE 1.7FRX115CM (UROLOGICAL SUPPLIES) IMPLANT
FIBER LASER TRAC TIP (UROLOGICAL SUPPLIES) IMPLANT
GLOVE BIO SURGEON STRL SZ7 (GLOVE) ×2 IMPLANT
GLOVE BIO SURGEON STRL SZ7.5 (GLOVE) ×4 IMPLANT
GLOVE BIOGEL PI IND STRL 7.0 (GLOVE) IMPLANT
GLOVE BIOGEL PI INDICATOR 7.0 (GLOVE) ×2
GOWN STRL REUS W/ TWL XL LVL3 (GOWN DISPOSABLE) ×2 IMPLANT
GOWN STRL REUS W/TWL XL LVL3 (GOWN DISPOSABLE) ×8 IMPLANT
GUIDEWIRE ANG ZIPWIRE 038X150 (WIRE) IMPLANT
GUIDEWIRE STR DUAL SENSOR (WIRE) ×4 IMPLANT
IV NS IRRIG 3000ML ARTHROMATIC (IV SOLUTION) ×14 IMPLANT
KIT TURNOVER CYSTO (KITS) ×4 IMPLANT
LOOP CUT BIPOLAR 24F LRG (ELECTROSURGICAL) ×2 IMPLANT
MANIFOLD NEPTUNE II (INSTRUMENTS) ×4 IMPLANT
NS IRRIG 500ML POUR BTL (IV SOLUTION) ×4 IMPLANT
PACK CYSTO (CUSTOM PROCEDURE TRAY) ×4 IMPLANT
SYRINGE IRR TOOMEY STRL 70CC (SYRINGE) ×2 IMPLANT
TUBE CONNECTING 12'X1/4 (SUCTIONS) ×1
TUBE CONNECTING 12X1/4 (SUCTIONS) ×3 IMPLANT
TUBING UROLOGY SET (TUBING) ×1 IMPLANT
WATER STERILE IRR 3000ML UROMA (IV SOLUTION) ×4 IMPLANT

## 2019-06-30 NOTE — Discharge Instructions (Signed)
Transurethral Resection of Bladder Tumor (TURBT) or Bladder Biopsy   Definition:  Transurethral Resection of the Bladder Tumor is a surgical procedure used to diagnose and remove tumors within the bladder. TURBT is the most common treatment for early stage bladder cancer.  General instructions:     Your recent bladder surgery requires very little post hospital care but some definite precautions.  Despite the fact that no skin incisions were used, the area around the bladder incisions are raw and covered with scabs to promote healing and prevent bleeding. Certain precautions are needed to insure that the scabs are not disturbed over the next 2-4 weeks while the healing proceeds.  Because the raw surface inside your bladder and the irritating effects of urine you may expect frequency of urination and/or urgency (a stronger desire to urinate) and perhaps even getting up at night more often. This will usually resolve or improve slowly over the healing period. You may see some blood in your urine over the first 6 weeks. Do not be alarmed, even if the urine was clear for a while. Get off your feet and drink lots of fluids until clearing occurs. If you start to pass clots or don't improve call us.  Diet:  You may return to your normal diet immediately. Because of the raw surface of your bladder, alcohol, spicy foods, foods high in acid and drinks with caffeine may cause irritation or frequency and should be used in moderation. To keep your urine flowing freely and avoid constipation, drink plenty of fluids during the day (8-10 glasses). Tip: Avoid cranberry juice because it is very acidic.  Activity:  Your physical activity doesn't need to be restricted. However, if you are very active, you may see some blood in the urine. We suggest that you reduce your activity under the circumstances until the bleeding has stopped.  Bowels:  It is important to keep your bowels regular during the postoperative  period. Straining with bowel movements can cause bleeding. A bowel movement every other day is reasonable. Use a mild laxative if needed, such as milk of magnesia 2-3 tablespoons, or 2 Dulcolax tablets. Call if you continue to have problems. If you had been taking narcotics for pain, before, during or after your surgery, you may be constipated. Take a laxative if necessary.    Medication:  You should resume your pre-surgery medications unless told not to. In addition you may be given an antibiotic to prevent or treat infection. Antibiotics are not always necessary. All medication should be taken as prescribed until the bottles are finished unless you are having an unusual reaction to one of the drugs.   PROBLEMS YOU SHOULD REPORT TO [place]:  Fevers over 100.5 Fahrenheit.  Heavy bleeding, or clots ( See above notes about blood in urine ).  Inability to urinate.  Drug reactions ( hives, rash, nausea, vomiting, diarrhea ).  Severe burning or pain with urination that is not improving.    NO ADVIL, ALEVE, MOTRIN, IBUPROFEN UNTIL 330 PM TODAY    Post Anesthesia Home Care Instructions  Activity: Get plenty of rest for the remainder of the day. A responsible adult should stay with you for 24 hours following the procedure.  For the next 24 hours, DO NOT: -Drive a car -Paediatric nurse -Drink alcoholic beverages -Take any medication unless instructed by your physician -Make any legal decisions or sign important papers.  Meals: Start with liquid foods such as gelatin or soup. Progress to regular foods as tolerated. Avoid greasy,  spicy, heavy foods. If nausea and/or vomiting occur, drink only clear liquids until the nausea and/or vomiting subsides. Call your physician if vomiting continues.  Special Instructions/Symptoms: Your throat may feel dry or sore from the anesthesia or the breathing tube placed in your throat during surgery. If this causes discomfort, gargle with warm salt water.  The discomfort should disappear within 24 hours.  If you had a scopolamine patch placed behind your ear for the management of post- operative nausea and/or vomiting:  1. The medication in the patch is effective for 72 hours, after which it should be removed.  Wrap patch in a tissue and discard in the trash. Wash hands thoroughly with soap and water. 2. You may remove the patch earlier than 72 hours if you experience unpleasant side effects which may include dry mouth, dizziness or visual disturbances. 3. Avoid touching the patch. Wash your hands with soap and water after contact with the patch.

## 2019-06-30 NOTE — Anesthesia Preprocedure Evaluation (Addendum)
Anesthesia Evaluation  Patient identified by MRN, date of birth, ID band Patient awake    Reviewed: Allergy & Precautions, NPO status , Patient's Chart, lab work & pertinent test results  History of Anesthesia Complications Negative for: history of anesthetic complications  Airway Mallampati: I  TM Distance: >3 FB Neck ROM: Full    Dental  (+) Teeth Intact, Dental Advisory Given, Caps,    Pulmonary neg pulmonary ROS,    Pulmonary exam normal        Cardiovascular negative cardio ROS Normal cardiovascular exam Rhythm:Regular Rate:Normal     Neuro/Psych PSYCHIATRIC DISORDERS Anxiety Depression negative neurological ROS     GI/Hepatic negative GI ROS, Hepatic steatosis   Endo/Other  negative endocrine ROS  Renal/GU Renal InsufficiencyRenal disease (congenital renal anomalies, recurrent UTIs, hydronephrosis)  negative genitourinary   Musculoskeletal  (+) Arthritis , Rheumatoid disorders,    Abdominal   Peds  Hematology negative hematology ROS (+)   Anesthesia Other Findings Difficult IV stick- small veins  Reproductive/Obstetrics                           Anesthesia Physical Anesthesia Plan  ASA: II  Anesthesia Plan: General   Post-op Pain Management:    Induction: Intravenous  PONV Risk Score and Plan: 3 and Ondansetron, Dexamethasone, Midazolam and Treatment may vary due to age or medical condition  Airway Management Planned: LMA  Additional Equipment: None  Intra-op Plan:   Post-operative Plan: Extubation in OR  Informed Consent: I have reviewed the patients History and Physical, chart, labs and discussed the procedure including the risks, benefits and alternatives for the proposed anesthesia with the patient or authorized representative who has indicated his/her understanding and acceptance.     Dental advisory given  Plan Discussed with:   Anesthesia Plan Comments:         Anesthesia Quick Evaluation

## 2019-06-30 NOTE — H&P (Signed)
58 year old who presents today with 3 weeks of left lower quadrant pain. She also is having associated gross hematuria. She was seen in January 2020 for gross hematuria and had a stone protocol CT scan at that time. She was not having any pain or associated symptoms beyond the gross hematuria. This stone protocol CT scan demonstrated several 2 mm nonobstructing stones in the left upper pole. These images were done at [facility]. Since that time, the patient has developed left lower quadrant pain. He has intermittent symptoms of nausea. She denies any fevers or chills. She is not having any worsening frequency or urgency. The pain seems to be reasonably well tolerated.   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 She was seen and followed by Dr. Rosana Hoes for a long time. He did a evaluation in 2014, for which I do not have his records. This was done under anesthesia, and likely was for hematuria.   05/16/2019:  CT impression-  No explanation for hematuria. No nephrolithiasis, ureterolithiasis,  enhancing renal cortical lesion, or filling defects within the  collecting systems.   Cortical scarring on the LEFT. Bilateral duplicated collecting  systems.   No bladder stones or filling defects in the bladder which does not  excluded a bladder lesion.    Interval: Today the patient follows up for gross hematuria. She was seen 1 month ago and had a CT scan that was largely unremarkable in did not have any good explanations for her gross hematuria. The patient continues to have gross hematuria, but is otherwise asymptomatic. She denies any dysuria. She denies any worsening frequency or urgency.     ALLERGIES: Levaquin TABS    MEDICATIONS: Ultram 50 mg tablet 1-2 tablet PO Q 6 H  Zofran 4 mg tablet 1 tablet PO Q 4 H  Dextroamphetamine Sulfate 10 mg tablet  Prednisone 50 mg tablet Take one tablet by mouth 13 hours, one tablet 7  hours, and one 1 hour prior to CT scan  Prilosec Otc 20 mg tablet, delayed release Oral  Probiotic Oral Capsule Oral  Prozac 20 mg capsule Oral     GU PSH: Locm 300-399Mg /Ml Iodine,1Ml - 05/16/2019       PSH Notes: Tonsillectomy, Ureter Procedures   NON-GU PSH: Laparoscope Proc; Ureter - 2010 Remove Tonsils - 2016     GU PMH: Gross hematuria - 05/11/2019 Renal calculus - 05/11/2019 Acute Cystitis/UTI, Acute cystitis without hematuria - 2016 Atrophy of kidney, Atrophic kidney, 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, Atrophic vaginitis - 2014    NON-GU PMH: Encounter for general adult medical examination without abnormal findings, Encounter for preventive health examination - 2016 Personal history of other specified conditions, History of heartburn - 2016 Rheumatoid Arthritis, Rheumatoid Arthritis - 2014 Arthritis GERD    FAMILY HISTORY: 1 Daughter - No Family History 1 son - No Family History Family Health Status - Father alive at age 25 - 37 In Family Family Health Status - Mother's Age - Runs In Family Family Health Status Number - Runs In Family Nephrolithiasis, uric acid - Runs In Family ovarian cancer - Grandmother   SOCIAL HISTORY: Marital Status: Married Preferred Language: English; Race: White Current Smoking Status: Patient has never smoked.   Tobacco Use Assessment Completed: Used Tobacco in last 30 days? Does not use smokeless tobacco. Drinks 4 drinks per week. Types of alcohol consumed: Wine.  Drinks 1 caffeinated drink per day.  Notes: Never smoked tobacco, Divorced, Number of children, Occupation, Tobacco Use, Caffeine Use, Alcohol Use, Occupation:, Marital History - Single   REVIEW OF SYSTEMS:    GU Review Female:   Patient reports frequent urination, hard to postpone urination, get up at night to urinate, and leakage of urine. Patient denies burning /pain with urination, stream starts and stops, trouble starting your stream, have to  strain to urinate, and being pregnant.  Gastrointestinal (Upper):   Patient denies nausea, vomiting, and indigestion/ heartburn.  Gastrointestinal (Lower):   Patient reports constipation. Patient denies diarrhea.  Constitutional:   Patient denies fever, night sweats, weight loss, and fatigue.  Skin:   Patient denies skin rash/ lesion and itching.  Eyes:   Patient denies blurred vision and double vision.  Ears/ Nose/ Throat:   Patient denies sore throat and sinus problems.  Hematologic/Lymphatic:   Patient denies swollen glands and easy bruising.  Cardiovascular:   Patient denies leg swelling and chest pains.  Respiratory:   Patient denies cough and shortness of breath.  Endocrine:   Patient denies excessive thirst.  Musculoskeletal:   Patient denies back pain and joint pain.  Neurological:   Patient denies headaches and dizziness.  Psychologic:   Patient denies depression and anxiety.   VITAL SIGNS:      06/20/2019 12:16 PM  Weight 136 lb / 61.69 kg  BP 114/83 mmHg  Pulse 93 /min  Temperature 97.6 F / 36.4 C   GU PHYSICAL EXAMINATION:    External Genitalia: No hirsutism, no rash, no scarring, no cyst, no erythematous lesion, no papular lesion, no blanched lesion, no warty lesion. No edema.  Urethral Meatus: Normal size. Normal position. No discharge.  Urethra: No tenderness, no mass, no scarring. No hypermobility. No leakage.  Bladder: Normal to palpation, no tenderness, no mass, normal size.  Vagina: No atrophy, no stenosis. No rectocele. No cystocele. No enterocele.   MULTI-SYSTEM PHYSICAL EXAMINATION:    Constitutional: Well-nourished. No physical deformities. Normally developed. Good grooming.  Respiratory: Normal breath sounds. No labored breathing, no use of accessory muscles.   Cardiovascular: Regular rate and rhythm. No murmur, no gallop. Normal temperature, normal extremity pulses, no swelling, no varicosities.      PAST DATA REVIEWED:  Source Of History:  Patient   Records Review:   Previous Doctor Records, Previous Patient Records, POC Tool  X-Ray Review: C.T. Abdomen/Pelvis: Reviewed Films. Discussed With Patient.     PROCEDURES:         Flexible Cystoscopy - 52000  Risks, benefits, and some of the potential complications of the procedure were discussed at length with the patient including infection, bleeding, voiding discomfort, urinary retention, fever, chills, sepsis, and others. All questions were answered. Informed consent was obtained. Antibiotic prophylaxis was given. Sterile technique and intraurethral analgesia were used. We also premedicated the patient with 20 mg of oral Valium, lidocaine jelly per urethra, and 30 mg of IM Toradol. We subsequently induced nitric oxide gas it with a 50 50 mixture having her breathe in through the nebulizer pipe. Once adequate anesthesia was induced, we proceeded.  Meatus:  Normal size. Normal location. Normal condition.  Urethra:  No hypermobility. No leakage.  Ureteral Orifices:  Normal location. Normal size. Normal shape. Effluxed clear urine.  Bladder:  In the midline at the trigone there was a raised area concerning for transitional cell carcinoma. This measured approximately 1 cm.       The lower urinary tract was carefully examined. The procedure was well-tolerated and without  complications. Antibiotic instructions were given. Instructions were given to call the office immediately for bloody urine, difficulty urinating, urinary retention, painful or frequent urination, fever, chills, nausea, vomiting or other illness. The patient stated that she understood these instructions and would comply with them.         Urinalysis w/Scope Dipstick Dipstick Cont'd Micro  Color: Yellow Bilirubin: Neg mg/dL WBC/hpf: 0 - 5/hpf  Appearance: Cloudy Ketones: 1+ mg/dL RBC/hpf: 40 - 60/hpf  Specific Gravity: 1.020 Blood: 3+ ery/uL Bacteria: Rare (0-9/hpf)  pH: 5.5 Protein: Neg mg/dL Cystals: NS (Not Seen)  Glucose: Neg  mg/dL Urobilinogen: 0.2 mg/dL Casts: NS (Not Seen)    Nitrites: Neg Trichomonas: Not Present    Leukocyte Esterase: Trace leu/uL Mucous: Not Present      Epithelial Cells: 0 - 5/hpf      Yeast: NS (Not Seen)      Sperm: Not Present         Ketoralac 30mg  - W1093, 23557 Qty: 30 Adm. By: Cristobal Goldmann  Unit: mg Lot No [id number]  Route: IM Exp. 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 It measured approximately 1 cm. I recommended proceeding to the operating room for bilateral retrograde pyelogram and a bladder biopsy/resection. I spoke to the patient and her partner about this in significant detail, and she opted to proceed.

## 2019-06-30 NOTE — Transfer of Care (Signed)
Immediate Anesthesia Transfer of Care Note  Patient: Jocelyn Kline  Procedure(s) Performed: CYSTOSCOPY WITH BIOPSY (N/A ) CYSTOSCOPY WITH RETROGRADE PYELOGRAM (Bilateral )  Patient Location: PACU  Anesthesia Type:General  Level of Consciousness: awake, alert , oriented and patient cooperative  Airway & Oxygen Therapy: Patient Spontanous Breathing and Patient connected to nasal cannula oxygen  Post-op Assessment: Report given to RN and Post -op Vital signs reviewed and stable  Post vital signs: Reviewed and stable  Last Vitals:  Vitals Value Taken Time  BP    Temp    Pulse    Resp    SpO2      Last Pain:  Vitals:   06/30/19 0657  TempSrc: Oral         Complications: No apparent anesthesia complications

## 2019-06-30 NOTE — Interval H&P Note (Signed)
History and Physical Interval Note:  06/30/2019 8:43 AM  Jocelyn Kline  has presented today for surgery, with the diagnosis of GROSS HEMATURIA.  The various methods of treatment have been discussed with the patient and family. After consideration of risks, benefits and other options for treatment, the patient has consented to  Procedure(s): CYSTOSCOPY WITH BIOPSY (N/A) CYSTOSCOPY WITH RETROGRADE PYELOGRAM (Bilateral) as a surgical intervention.  The patient's history has been reviewed, patient examined, no change in status, stable for surgery.  I have reviewed the patient's chart and labs.  Questions were answered to the patient's satisfaction.     Ardis Hughs

## 2019-06-30 NOTE — Anesthesia Postprocedure Evaluation (Signed)
Anesthesia Post Note  Patient: Jocelyn Kline  Procedure(s) Performed: CYSTOSCOPY WITH BIOPSY (N/A ) CYSTOSCOPY WITH RETROGRADE PYELOGRAM (Bilateral )     Patient location during evaluation: PACU Anesthesia Type: General Level of consciousness: awake and alert Pain management: pain level controlled Vital Signs Assessment: post-procedure vital signs reviewed and stable Respiratory status: spontaneous breathing, nonlabored ventilation and respiratory function stable Cardiovascular status: blood pressure returned to baseline and stable Postop Assessment: no apparent nausea or vomiting Anesthetic complications: no    Last Vitals:  Vitals:   06/30/19 1030 06/30/19 1045  BP: 139/81 128/86  Pulse: 79 77  Resp: 12 10  Temp:    SpO2: 99% 98%    Last Pain:  Vitals:   06/30/19 1045  TempSrc:   PainSc: 6                  Lidia Collum

## 2019-06-30 NOTE — Anesthesia Procedure Notes (Addendum)
Procedure Name: LMA Insertion Date/Time: 06/30/2019 8:49 AM Performed by: Wanita Chamberlain, CRNA Pre-anesthesia Checklist: Patient identified, Emergency Drugs available, Suction available and Patient being monitored Patient Re-evaluated:Patient Re-evaluated prior to induction Oxygen Delivery Method: Circle system utilized Preoxygenation: Pre-oxygenation with 100% oxygen Induction Type: IV induction Ventilation: Mask ventilation without difficulty LMA: LMA inserted LMA Size: 3.0 Number of attempts: 1 Airway Equipment and Method: Bite block Placement Confirmation: breath sounds checked- equal and bilateral,  CO2 detector and positive ETCO2 Tube secured with: Tape Dental Injury: Teeth and Oropharynx as per pre-operative assessment

## 2019-06-30 NOTE — Op Note (Signed)
Preoperative diagnosis:  1. Gross hematuria, bladder tumor trigone 2 cm  Postoperative diagnosis:  1. Same  Procedure: 1. Transurethral resection of bladder tumor, trigone, 2 cm 2. Bladder biopsies with fulguration 3. 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  This was mostly on the patient's left side, and this area was all left intact. #2: The patient had 2 ureteral orifice ease on the right, the lower ureteral orifice drained the patient's upper pole.  A retrograde pyelogram was performed in both of these ureters.  The lower ureteral orifice demonstrated a normal caliber ureter with no filling defects or significant abnormalities and no hydronephrosis of the upper moiety.  The upper ureteral orifice demonstrated normal caliber ureter with a blunted lower pole moiety, but no filling defects. #3: There was only one identifiable pole ureteral orifice on the patient's left side, and it looked as if the ureter had been advanced as there was a little bit of a J-hook on the retrograde pyelogram with mild hydroureteronephrosis but no filling defects. #4: The patient had about a 2 cm bladder tumor at the trigone that was broad-based and appeared high-grade and invasive.  I do think that I was able to resect underneath the tumor, muscle was present in the specimen. #5: There were 2 satellite lesions in the posterior bladder wall that were easily removed and quite superficial.  These were sent with the original specimen. #6: The patient's bladder was quite vascular and indurated, almost like a IC type appearance, mostly on the floor of the bladder and halfway up the lateral edges.  There was mild amount of this on the posterior wall, and the dome was  normal.  These areas were biopsied and copiously fulgurated.  EBL: Minimal  Specimens:  #1: Bladder tumor trigone #2: Left lateral wall bladder biopsy #3: Posterior wall bladder biopsy #4: Right lateral wall bladder biopsy  Indication: Jocelyn Kline is a 58 y.o. patient with history of gross hematuria, and cystoscopy demonstrated an area of concern in the trigonal region.  After reviewing the management options for treatment, he elected to proceed with the above surgical procedure(s). We have discussed the potential benefits and risks of the procedure, side effects of the proposed treatment, the likelihood of the patient achieving the goals of the procedure, and any potential problems that might occur during the procedure or recuperation. Informed consent has been obtained.  Description of procedure:  The patient was taken to the operating room and general anesthesia was induced.  The patient was placed in the dorsal lithotomy position, prepped and draped in the usual sterile fashion, and preoperative antibiotics were administered. A preoperative time-out was performed.   21 French 30 degrees cystoscope was gently passed through the patient's urethra and the bladder under visual guidance.  Cystoscopy was then commenced.  360 degrees cystoscopic evaluation was performed with the above findings.  I then exchanged the 30 degree scope for the 70 degree lens, and repeated cystoscopy with no additional findings.  Pulling back with a 7 degree lens I did note the skin bridge and perform cystoscopy within the diverticulum created by the skin bridge at the bladder neck.  This demonstrated several openings to the patient's bladder, but these were all left alone.  These were well away from the trigone.  I  then replaced a 30 degree lens and performed retrograde pyelograms with the above findings.  I then passed in the 26 [place] resectoscope sheath with a 30 degree lens using the visual obturator.  I  exchanged the obturator for the loop cautery resection element and performed a TURBT with the above findings.  The base of the tumor was copiously fulgurated as were the edges.  I then opted to perform bladder biopsies of the abnormal bladder walls, where there were no discrete lesions, but was very indurated and injected.  I perform these using the cold cup biopsy forceps.  I then copiously fulgurated these areas as well as the area surrounding them.  The tumor specimens were all removed and sent separately.  A B&O suppository was placed in the patient's rectum, and her bladder was emptied.  She was subsequently extubated return the PACU in stable condition.  Ardis Hughs, M.D.

## 2019-07-03 ENCOUNTER — Encounter (HOSPITAL_BASED_OUTPATIENT_CLINIC_OR_DEPARTMENT_OTHER): Payer: Self-pay | Admitting: Urology

## 2019-07-10 ENCOUNTER — Other Ambulatory Visit: Payer: Self-pay | Admitting: Urology

## 2019-07-19 ENCOUNTER — Other Ambulatory Visit: Payer: Self-pay | Admitting: Urology

## 2019-07-21 ENCOUNTER — Ambulatory Visit: Admit: 2019-07-21 | Payer: BLUE CROSS/BLUE SHIELD | Admitting: Urology

## 2019-07-21 SURGERY — TURBT (TRANSURETHRAL RESECTION OF BLADDER TUMOR)
Anesthesia: General

## 2023-10-01 ENCOUNTER — Other Ambulatory Visit: Payer: Self-pay

## 2023-10-01 ENCOUNTER — Ambulatory Visit
Admission: RE | Admit: 2023-10-01 | Discharge: 2023-10-01 | Disposition: A | Discharge status: Home / Self Care | Payer: Commercial Managed Care - PPO | Source: Ambulatory Visit | Attending: Nurse Practitioner | Admitting: Nurse Practitioner

## 2023-10-01 VITALS — BP 154/94 | HR 88 | Temp 98.0°F | Resp 20

## 2023-10-01 DIAGNOSIS — R300 Dysuria: Secondary | ICD-10-CM | POA: Insufficient documentation

## 2023-10-01 DIAGNOSIS — Z87440 Personal history of urinary (tract) infections: Secondary | ICD-10-CM | POA: Insufficient documentation

## 2023-10-01 DIAGNOSIS — Z87448 Personal history of other diseases of urinary system: Secondary | ICD-10-CM | POA: Insufficient documentation

## 2023-10-01 DIAGNOSIS — Z8551 Personal history of malignant neoplasm of bladder: Secondary | ICD-10-CM | POA: Insufficient documentation

## 2023-10-01 DIAGNOSIS — R399 Unspecified symptoms and signs involving the genitourinary system: Secondary | ICD-10-CM | POA: Diagnosis not present

## 2023-10-01 DIAGNOSIS — Z1152 Encounter for screening for COVID-19: Secondary | ICD-10-CM | POA: Insufficient documentation

## 2023-10-01 DIAGNOSIS — R52 Pain, unspecified: Secondary | ICD-10-CM | POA: Diagnosis present

## 2023-10-01 LAB — POCT URINALYSIS DIP (MANUAL ENTRY)
Bilirubin, UA: NEGATIVE
Blood, UA: NEGATIVE
Glucose, UA: NEGATIVE mg/dL
Ketones, POC UA: NEGATIVE mg/dL
Leukocytes, UA: NEGATIVE
Nitrite, UA: NEGATIVE
Protein Ur, POC: NEGATIVE mg/dL
Spec Grav, UA: 1.015 (ref 1.010–1.025)
Urobilinogen, UA: 0.2 U/dL
pH, UA: 5.5 (ref 5.0–8.0)

## 2023-10-01 MED ORDER — PHENAZOPYRIDINE HCL 100 MG PO TABS: 100.0000 mg | ORAL_TABLET | Freq: Three times a day (TID) | ORAL | 0 refills | Status: AC | PRN

## 2023-10-01 NOTE — ED Triage Notes (Signed)
Pt reports dysuria, urine odor, urinary frequency, chills for last few days. Pt reports hx of bladder cancer and reports consulted urologist and was told to come to UC and provide specimen to get treated for possible infection due to upcoming cystoscopy.

## 2023-10-01 NOTE — ED Provider Notes (Signed)
[facility]    CSN: [id number] Arrival date & time: 10/01/23  0851      History   Chief Complaint Chief Complaint  Patient presents with   Dysuria    HPI Jocelyn Kline is a 62 y.o. female.   The history is provided by the patient.   Patient presents for complaints of dysuria, urine odor, urinary frequency, and chills that been present for the past 2 to 3 days.  Patient denies chest pain, abdominal pain, nausea, vomiting, diarrhea, hematuria, low back pain, decreased urine stream, or vaginal symptoms.  Patient with a significant past medical history to include hydronephrosis, pyelonephritis, recurrent UTIs, and bladder cancer.  Patient states she is scheduled for cytoscopy on 10/24.  She states she did reach out to her urologist regarding her symptoms, and he advised her to go to an urgent care for evaluation.  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---  per pt had two surgeries 1967 on left side;  still has right side duplication   Depression    Gross hematuria    History of hydronephrosis    History of pyelonephritis    History of recurrent UTIs    chronic   RA (rheumatoid arthritis) (HCC)    06-29-2019  per pt in remission, no meds  (in between rheumotologist, followed by pcp)   Urgency of urination     Patient Active Problem List   Diagnosis Date Noted   Fatty liver 12/08/2012   NONSPEC REACT TUBERCULIN SKN TEST W/O ACTV TB 10/13/2010   RHEUMATOID ARTHRITIS 05/27/2009   POLYARTHRALGIA 10/10/2008   FATIGUE 10/10/2008   HYPERCALCEMIA 05/18/2008   LIVER FUNCTION TESTS, ABNORMAL 05/18/2008   ANXIETY STATE NOS 04/26/2007   Symptomatic menopausal or female climacteric states 04/26/2007   ENDOMETRIOSIS NEC 10/14/2006   ATTENTION DEFICIT, W/O HYPERACTIVITY 09/28/2006   RHINITIS, ALLERGIC 09/28/2006   INCONTINENCE, STRESS,  FEMALE 09/28/2006   PALPITATIONS 09/28/2006    Past Surgical History:  Procedure Laterality Date   BUNIONECTOMY Bilateral 2014   CARPAL TUNNEL RELEASE Right 07-28-2016    [facility]   W/  TRIGGER RELEASE LONG AND RING FINGERS   CYSTOSCOPY W/ RETROGRADES Bilateral 06/30/2019   Procedure: CYSTOSCOPY WITH RETROGRADE PYELOGRAM;  Surgeon: Crist Fat, MD;  Location: [facility];  Service: Urology;  Laterality: Bilateral;   CYSTOSCOPY WITH BIOPSY N/A 06/30/2019   Procedure: CYSTOSCOPY WITH BIOPSY;  Surgeon: Crist Fat, MD;  Location: [facility];  Service: Urology;  Laterality: N/A;   CYSTOSCOPY WITH RETROGRADE PYELOGRAM, URETEROSCOPY AND STENT PLACEMENT Left 08-15-2013   DR R. DAVIS @[facility]    TONSILLECTOMY AND ADENOIDECTOMY  AGE 85   URETERAL REIMPLANTION Left x2  1967   for duplicated left ureter renal system    OB History   No obstetric history on file.      Home Medications    Prior to Admission medications   Medication Sig Start Date End Date Taking? Authorizing Provider  dextroamphetamine (DEXTROSTAT) 10 MG tablet Take by mouth. 09/13/23  Yes [provider]  FLUoxetine (PROZAC) 10 MG capsule Take 30 mg by mouth. 09/13/23 12/12/23 Yes [provider]  omeprazole (PRILOSEC) 20 MG capsule Take by mouth. 03/02/14  Yes [provider]  phenazopyridine (PYRIDIUM) 100 MG tablet Take 1 tablet (100 mg total) by mouth 3 (three) times daily as needed for  pain. 10/01/23  Yes Giavonna Pflum-Warren, Sadie Haber, NP  progesterone (PROMETRIUM) 100 MG capsule Take 100 mg by mouth daily.   Yes [provider]  acetaminophen (TYLENOL) 500 MG tablet Take 2 tablets (1,000 mg total) by mouth every 6 (six) hours as needed. 06/30/19   Crist Fat, MD  dextroamphetamine (DEXTROSTAT) 10 MG tablet Take 1 tablet (10 mg total) by mouth 2 (two) times daily. Patient taking differently: Take 10 mg by mouth as needed. 07/08/12   Agapito Games, MD  FLUoxetine (PROZAC) 20 MG capsule TAKE 1 CAPSULE BY MOUTH ONCE DAILY Patient taking differently: Take 20 mg by mouth daily. 11/21/13   Agapito Games, MD  mirabegron ER (MYRBETRIQ) 50 MG TB24 tablet Take 1 tablet (50 mg total) by mouth daily. 06/30/19   Crist Fat, MD  omeprazole (PRILOSEC) 20 MG capsule Take 20 mg by mouth daily.    [provider]  oxyCODONE (ROXICODONE) 5 MG immediate release tablet Take 1-2 tablets (5-10 mg total) by mouth every 6 (six) hours as needed for severe pain. 06/30/19   Crist Fat, MD  Probiotic Product (PROBIOTIC DAILY PO) Take 1 capsule by mouth daily.    [provider]    Family History History reviewed. No pertinent family history.  Social History Social History   Tobacco Use   Smoking status: Never   Smokeless tobacco: Never  Vaping Use   Vaping status: Never Used  Substance Use Topics   Alcohol use: Yes    Comment: occasional   Drug use: Never     Allergies   Ambien [zolpidem tartrate], Levofloxacin, Trazodone and nefazodone, and Contrast media [iodinated contrast media]   Review of Systems Review of Systems Per HPI  Physical Exam Triage Vital Signs ED Triage Vitals [10/01/23 0912]  Encounter Vitals Group     BP (!) 154/94     Systolic BP Percentile      Diastolic BP Percentile      Pulse Rate 88     Resp 20     Temp 98 F (36.7 C)     Temp Source Oral     SpO2 98 %     Weight      Height      Head Circumference      Peak Flow      Pain Score 6     Pain Loc      Pain Education      Exclude from Growth Chart    No data found.  Updated Vital Signs BP (!) 154/94 (BP Location: Right Arm)   Pulse 88   Temp 98 F (36.7 C) (Oral)   Resp 20   SpO2 98%   Visual Acuity Right Eye Distance:   Left Eye Distance:   Bilateral Distance:    Right Eye Near:   Left Eye Near:    Bilateral Near:     Physical Exam Vitals and nursing note reviewed.  Constitutional:       General: She is not in acute distress.    Appearance: Normal appearance.  HENT:     Head: Normocephalic.  Eyes:     Extraocular Movements: Extraocular movements intact.     Conjunctiva/sclera: Conjunctivae normal.     Pupils: Pupils are equal, round, and reactive to light.  Cardiovascular:     Rate and Rhythm: Regular rhythm.     Pulses: Normal pulses.     Heart sounds: Normal heart sounds.  Pulmonary:  Effort: Pulmonary effort is normal. No respiratory distress.     Breath sounds: Normal breath sounds. No stridor. No wheezing, rhonchi or rales.  Abdominal:     General: Bowel sounds are normal.     Palpations: Abdomen is soft.     Tenderness: There is abdominal tenderness in the suprapubic area. There is right CVA tenderness.  Musculoskeletal:     Cervical back: Normal range of motion.  Skin:    General: Skin is warm and dry.  Neurological:     General: No focal deficit present.     Mental Status: She is alert and oriented to person, place, and time.  Psychiatric:        Mood and Affect: Mood normal.        Behavior: Behavior normal.      UC Treatments / Results  Labs (all labs ordered are listed, but only abnormal results are displayed) Labs Reviewed  POCT URINALYSIS DIP (MANUAL ENTRY)    EKG   Radiology No results found.  Procedures Procedures (including critical care time)  Medications Ordered in UC Medications - No data to display  Initial Impression / Assessment and Plan / UC Course  I have reviewed the triage vital signs and the nursing notes.  Pertinent labs & imaging results that were available during my care of the patient were reviewed by me and considered in my medical decision making (see chart for details).  The patient is well-appearing, she is in no acute distress, vital signs are stable.  Urinalysis is negative for a urinary tract infection.  No indication for culture at this time.  Given patient is experiencing dysuria, will treat with  Pyridium 100 mg.  Discussed with patient the results of her urinalysis, and advised that she follow-up with urology to determine next steps regarding her current symptoms.  Patient was given strict ER follow-up precautions to include worsening symptoms, fever, chills, or other concerns.  Patient is in agreement with this plan of care and verbalizes understanding.  All questions were answered.  Patient stable for discharge.   Final Clinical Impressions(s) / UC Diagnoses   Final diagnoses:  Dysuria  UTI symptoms  History of recurrent UTIs  History of pyelonephritis  History of bladder cancer     Discharge Instructions      The urinalysis did not indicate a urinary tract infection. Take medication as prescribed. Make sure you are drinking plenty of fluids.  Try to drink at least 8-10 8 ounce glasses of water daily. Avoid caffeine to reduce bladder spasms.  This includes tea, soda, and coffee. Please follow-up with urology to advise of the results of your urinalysis. If you experience worsening symptoms, or other concerns, please go to the emergency department immediately for further evaluation. Follow-up as needed.     ED Prescriptions     Medication Sig Dispense Auth. Provider   phenazopyridine (PYRIDIUM) 100 MG tablet Take 1 tablet (100 mg total) by mouth 3 (three) times daily as needed for pain. 10 tablet Trayven Lumadue-Warren, Sadie Haber, NP      PDMP not reviewed this encounter.   Abran Cantor, NP 10/01/23 [phone]

## 2023-10-01 NOTE — ED Notes (Signed)
Went in to review discharge instructions and pt reported "when I felt like this before, it was covid. Now that I know its not a UTI can I also have a covid test?" Consulted NP and given verbal order to obtain Covid swab.

## 2023-10-01 NOTE — Discharge Instructions (Signed)
The urinalysis did not indicate a urinary tract infection. Take medication as prescribed. Make sure you are drinking plenty of fluids.  Try to drink at least 8-10 8 ounce glasses of water daily. Avoid caffeine to reduce bladder spasms.  This includes tea, soda, and coffee. Please follow-up with urology to advise of the results of your urinalysis. If you experience worsening symptoms, or other concerns, please go to the emergency department immediately for further evaluation. Follow-up as needed.

## 2023-10-02 LAB — SARS CORONAVIRUS 2 (TAT 6-24 HRS): SARS Coronavirus 2: NEGATIVE
